# Patient Record
Sex: Female | Born: 1940
Health system: Southern US, Community
[De-identification: ages and names within clinical notes are randomized; demographics above are authoritative.]

## PROBLEM LIST (undated history)

## (undated) DIAGNOSIS — E7800 Pure hypercholesterolemia, unspecified: Secondary | ICD-10-CM

## (undated) DIAGNOSIS — E785 Hyperlipidemia, unspecified: Secondary | ICD-10-CM

## (undated) DIAGNOSIS — I350 Nonrheumatic aortic (valve) stenosis: Secondary | ICD-10-CM

## (undated) DIAGNOSIS — R55 Syncope and collapse: Secondary | ICD-10-CM

## (undated) DIAGNOSIS — I10 Essential (primary) hypertension: Secondary | ICD-10-CM

## (undated) DIAGNOSIS — E079 Disorder of thyroid, unspecified: Secondary | ICD-10-CM

## (undated) DIAGNOSIS — E039 Hypothyroidism, unspecified: Secondary | ICD-10-CM

## (undated) HISTORY — PX: ABDOMINAL HYSTERECTOMY: SHX81

## (undated) HISTORY — DX: Nonrheumatic aortic (valve) stenosis: I35.0

## (undated) HISTORY — DX: Hypothyroidism, unspecified: E03.9

---

## 2017-08-17 ENCOUNTER — Emergency Department (HOSPITAL_COMMUNITY): Payer: Medicare Other

## 2017-08-17 ENCOUNTER — Encounter (HOSPITAL_COMMUNITY): Payer: Self-pay | Admitting: Emergency Medicine

## 2017-08-17 ENCOUNTER — Other Ambulatory Visit: Payer: Self-pay

## 2017-08-17 ENCOUNTER — Inpatient Hospital Stay (HOSPITAL_COMMUNITY)
Admission: EM | Admit: 2017-08-17 | Discharge: 2017-08-19 | DRG: 312 | Disposition: A | Discharge status: Home Health | Payer: Medicare Other | Attending: Family Medicine | Admitting: Family Medicine

## 2017-08-17 ENCOUNTER — Inpatient Hospital Stay (HOSPITAL_COMMUNITY): Payer: Medicare Other

## 2017-08-17 ENCOUNTER — Emergency Department (HOSPITAL_COMMUNITY)
Admission: EM | Admit: 2017-08-17 | Discharge: 2017-08-17 | Disposition: A | Discharge status: Home / Self Care | Payer: Medicare Other | Source: Home / Self Care | Attending: Emergency Medicine | Admitting: Emergency Medicine

## 2017-08-17 DIAGNOSIS — I10 Essential (primary) hypertension: Secondary | ICD-10-CM

## 2017-08-17 DIAGNOSIS — R4189 Other symptoms and signs involving cognitive functions and awareness: Secondary | ICD-10-CM | POA: Diagnosis not present

## 2017-08-17 DIAGNOSIS — M25531 Pain in right wrist: Secondary | ICD-10-CM | POA: Diagnosis present

## 2017-08-17 DIAGNOSIS — R001 Bradycardia, unspecified: Secondary | ICD-10-CM | POA: Diagnosis present

## 2017-08-17 DIAGNOSIS — R011 Cardiac murmur, unspecified: Secondary | ICD-10-CM | POA: Diagnosis not present

## 2017-08-17 DIAGNOSIS — R4701 Aphasia: Secondary | ICD-10-CM

## 2017-08-17 DIAGNOSIS — E039 Hypothyroidism, unspecified: Secondary | ICD-10-CM | POA: Diagnosis present

## 2017-08-17 DIAGNOSIS — E7800 Pure hypercholesterolemia, unspecified: Secondary | ICD-10-CM | POA: Diagnosis not present

## 2017-08-17 DIAGNOSIS — Z9181 History of falling: Secondary | ICD-10-CM

## 2017-08-17 DIAGNOSIS — I050 Rheumatic mitral stenosis: Secondary | ICD-10-CM | POA: Diagnosis present

## 2017-08-17 DIAGNOSIS — R29818 Other symptoms and signs involving the nervous system: Secondary | ICD-10-CM | POA: Diagnosis not present

## 2017-08-17 DIAGNOSIS — E785 Hyperlipidemia, unspecified: Secondary | ICD-10-CM | POA: Diagnosis present

## 2017-08-17 DIAGNOSIS — I350 Nonrheumatic aortic (valve) stenosis: Secondary | ICD-10-CM

## 2017-08-17 DIAGNOSIS — F809 Developmental disorder of speech and language, unspecified: Secondary | ICD-10-CM

## 2017-08-17 DIAGNOSIS — R410 Disorientation, unspecified: Secondary | ICD-10-CM

## 2017-08-17 DIAGNOSIS — R55 Syncope and collapse: Secondary | ICD-10-CM | POA: Diagnosis not present

## 2017-08-17 DIAGNOSIS — M11231 Other chondrocalcinosis, right wrist: Secondary | ICD-10-CM | POA: Diagnosis present

## 2017-08-17 DIAGNOSIS — F0390 Unspecified dementia without behavioral disturbance: Secondary | ICD-10-CM | POA: Diagnosis present

## 2017-08-17 DIAGNOSIS — R9089 Other abnormal findings on diagnostic imaging of central nervous system: Secondary | ICD-10-CM | POA: Diagnosis present

## 2017-08-17 DIAGNOSIS — I361 Nonrheumatic tricuspid (valve) insufficiency: Secondary | ICD-10-CM | POA: Diagnosis not present

## 2017-08-17 DIAGNOSIS — Z79899 Other long term (current) drug therapy: Secondary | ICD-10-CM | POA: Diagnosis not present

## 2017-08-17 DIAGNOSIS — Z8249 Family history of ischemic heart disease and other diseases of the circulatory system: Secondary | ICD-10-CM

## 2017-08-17 HISTORY — DX: Essential (primary) hypertension: I10

## 2017-08-17 HISTORY — DX: Disorder of thyroid, unspecified: E07.9

## 2017-08-17 HISTORY — DX: Hyperlipidemia, unspecified: E78.5

## 2017-08-17 HISTORY — DX: Pure hypercholesterolemia, unspecified: E78.00

## 2017-08-17 HISTORY — DX: Syncope and collapse: R55

## 2017-08-17 LAB — BASIC METABOLIC PANEL
ANION GAP: 8 (ref 5–15)
BUN: 9 mg/dL (ref 6–20)
CHLORIDE: 104 mmol/L (ref 101–111)
CO2: 24 mmol/L (ref 22–32)
Calcium: 8.8 mg/dL — ABNORMAL LOW (ref 8.9–10.3)
Creatinine, Ser: 0.84 mg/dL (ref 0.44–1.00)
GFR calc Af Amer: >60 mL/min (ref >60)
GFR calc non Af Amer: >60 mL/min (ref >60)
GLUCOSE: 170 mg/dL — AB (ref 65–99)
POTASSIUM: 4 mmol/L (ref 3.5–5.1)
Sodium: 136 mmol/L (ref 135–145)

## 2017-08-17 LAB — CBC
HEMATOCRIT: 38.2 % (ref 36.0–46.0)
HEMOGLOBIN: 12.8 g/dL (ref 12.0–15.0)
MCH: 32.5 pg (ref 26.0–34.0)
MCHC: 33.5 g/dL (ref 30.0–36.0)
MCV: 97 fL (ref 78.0–100.0)
Platelets: 138 10*3/uL — ABNORMAL LOW (ref 150–400)
RBC: 3.94 MIL/uL (ref 3.87–5.11)
RDW: 13.4 % (ref 11.5–15.5)
WBC: 9.4 10*3/uL (ref 4.0–10.5)

## 2017-08-17 LAB — URINALYSIS, ROUTINE W REFLEX MICROSCOPIC
Bilirubin Urine: NEGATIVE
GLUCOSE, UA: NEGATIVE mg/dL
KETONES UR: NEGATIVE mg/dL
NITRITE: NEGATIVE
Protein, ur: 30 mg/dL — AB
Specific Gravity, Urine: 1.018 (ref 1.005–1.030)
pH: 6 (ref 5.0–8.0)

## 2017-08-17 LAB — GLUCOSE, CAPILLARY: Glucose-Capillary: 116 mg/dL — ABNORMAL HIGH (ref 65–99)

## 2017-08-17 LAB — I-STAT TROPONIN, ED: TROPONIN I, POC: 0.01 ng/mL (ref 0.00–0.08)

## 2017-08-17 LAB — I-STAT CHEM 8, ED
BUN: 11 mg/dL (ref 6–20)
CREATININE: 0.7 mg/dL (ref 0.44–1.00)
Calcium, Ion: 1.15 mmol/L (ref 1.15–1.40)
Chloride: 101 mmol/L (ref 101–111)
Glucose, Bld: 176 mg/dL — ABNORMAL HIGH (ref 65–99)
HEMATOCRIT: 35 % — AB (ref 36.0–46.0)
HEMOGLOBIN: 11.9 g/dL — AB (ref 12.0–15.0)
POTASSIUM: 3.6 mmol/L (ref 3.5–5.1)
Sodium: 136 mmol/L (ref 135–145)
TCO2: 23 mmol/L (ref 22–32)

## 2017-08-17 LAB — CBG MONITORING, ED: Glucose-Capillary: 138 mg/dL — ABNORMAL HIGH (ref 65–99)

## 2017-08-17 LAB — ETHANOL

## 2017-08-17 MED ORDER — ACETAMINOPHEN 325 MG PO TABS: 650.0000 mg | ORAL_TABLET | ORAL | Status: DC | PRN

## 2017-08-17 MED ORDER — IOPAMIDOL (ISOVUE-370) INJECTION 76%
INTRAVENOUS | Status: AC
  Administered 2017-08-17: 50 mL via INTRAVENOUS
  Filled 2017-08-17: qty 50

## 2017-08-17 MED ORDER — ROSUVASTATIN CALCIUM 20 MG PO TABS
20.0000 mg | ORAL_TABLET | Freq: Every day | ORAL | Status: DC
  Administered 2017-08-17 – 2017-08-19 (×3): 20 mg via ORAL
  Filled 2017-08-17 (×3): qty 1

## 2017-08-17 MED ORDER — ASPIRIN EC 81 MG PO TBEC
81.0000 mg | DELAYED_RELEASE_TABLET | Freq: Every day | ORAL | Status: DC
  Administered 2017-08-17 – 2017-08-19 (×3): 81 mg via ORAL
  Filled 2017-08-17 (×3): qty 1

## 2017-08-17 MED ORDER — LISINOPRIL 5 MG PO TABS
5.0000 mg | ORAL_TABLET | Freq: Every day | ORAL | Status: DC
  Administered 2017-08-17 – 2017-08-19 (×3): 5 mg via ORAL
  Filled 2017-08-17 (×3): qty 1

## 2017-08-17 MED ORDER — ACETAMINOPHEN 160 MG/5ML PO SOLN: 650.0000 mg | ORAL | Status: DC | PRN

## 2017-08-17 MED ORDER — ENOXAPARIN SODIUM 40 MG/0.4ML ~~LOC~~ SOLN
40.0000 mg | SUBCUTANEOUS | Status: DC
  Administered 2017-08-17 – 2017-08-18 (×2): 40 mg via SUBCUTANEOUS
  Filled 2017-08-17 (×2): qty 0.4

## 2017-08-17 MED ORDER — STROKE: EARLY STAGES OF RECOVERY BOOK
Freq: Once | Status: DC
  Filled 2017-08-17: qty 1

## 2017-08-17 MED ORDER — ACETAMINOPHEN 650 MG RE SUPP: 650.0000 mg | RECTAL | Status: DC | PRN

## 2017-08-17 MED ORDER — LEVOTHYROXINE SODIUM 75 MCG PO TABS
75.0000 ug | ORAL_TABLET | Freq: Every day | ORAL | Status: DC
  Administered 2017-08-19: 75 ug via ORAL
  Filled 2017-08-17 (×2): qty 1

## 2017-08-17 NOTE — ED Notes (Signed)
On way to CT 

## 2017-08-17 NOTE — ED Provider Notes (Signed)
[facility]   CSN: [id number] Arrival date & time: 08/17/17  0820     History   Chief Complaint Chief Complaint  Patient presents with  . Loss of Consciousness    HPI Adrienne Jackson is a 76 y.o. female.  Patient presents via ems after unresponsive episode at sister's home.  Patient has gotten up, was sitting at table. States she felt fine/normal. Then was noted to become unresponsive while sitting.   Did not fall or lose postural tone. No seizure activity described. Son assisted to floor. No trauma. Once supine, quickly regained consciousness, and was awake/alert, back to baseline within seconds. Hx similar episode approximately 6 months ago. No other recent episodes. Pt denies palpitations or irregular heart beat sensation. Denies any chest pain or discomfort. No sob. No headache. No numbness/weakness. No change in speech or vision. No recent change in meds. No recent blood loss or melena.    The history is provided by the patient.  Loss of Consciousness   Pertinent negatives include abdominal pain, back pain, chest pain, confusion, fever, headaches, palpitations and vomiting.    Past Medical History:  Diagnosis Date  . Hyperlipidemia   . Hypertension   . Thyroid disease     There are no active problems to display for this patient.   Past Surgical History:  Procedure Laterality Date  . ABDOMINAL HYSTERECTOMY      OB History    No data available       Home Medications    Prior to Admission medications   Not on File    Family History No family history on file.  Social History Social History   Tobacco Use  . Smoking status: Never Smoker  . Smokeless tobacco: Never Used  Substance Use Topics  . Alcohol use: No    Frequency: Never  . Drug use: No     Allergies   Patient has no known allergies.   Review of Systems Review of Systems  Constitutional: Negative for chills and fever.  HENT:  Negative for sore throat.   Eyes: Negative for visual disturbance.  Respiratory: Negative for shortness of breath.   Cardiovascular: Positive for syncope. Negative for chest pain and palpitations.  Gastrointestinal: Negative for abdominal pain, blood in stool, diarrhea and vomiting.  Genitourinary: Negative for dysuria.  Musculoskeletal: Negative for back pain and neck pain.  Skin: Negative for rash.  Neurological: Negative for headaches.  Hematological: Does not bruise/bleed easily.  Psychiatric/Behavioral: Negative for confusion.     Physical Exam Updated Vital Signs There were no vitals taken for this visit.  Physical Exam  Constitutional: She appears well-developed and well-nourished. No distress.  HENT:  Head: Atraumatic.  Mouth/Throat: Oropharynx is clear and moist.  Eyes: Conjunctivae are normal. Pupils are equal, round, and reactive to light. No scleral icterus.  Neck: Neck supple. No tracheal deviation present. No thyromegaly present.  No bruits.   Cardiovascular: Normal rate, regular rhythm and intact distal pulses. Exam reveals no gallop and no friction rub.  Murmur heard. +systolic murmur  Pulmonary/Chest: Effort normal and breath sounds normal. No respiratory distress.  Abdominal: Soft. Normal appearance and bowel sounds are normal. She exhibits no distension. There is no tenderness.  No pulsatile mass.   Genitourinary:  Genitourinary Comments: No cva tenderness  Musculoskeletal: She exhibits no edema.  Mild tenderness right wrist. No focal scaphoid tenderness.   Neurological: She is alert.  Speech fluent. Motor intact bil, stre 5/5. sens grossly intact.  Steady gait.   Skin: Skin is warm and dry. No rash noted. She is not diaphoretic.  Psychiatric: She has a normal mood and affect.  Nursing note and vitals reviewed.    ED Treatments / Results  Labs (all labs ordered are listed, but only abnormal results are displayed) Results for orders placed or performed  during the hospital encounter of 08/17/17  Basic metabolic panel  Result Value Ref Range   Sodium 136 135 - 145 mmol/L   Potassium 4.0 3.5 - 5.1 mmol/L   Chloride 104 101 - 111 mmol/L   CO2 24 22 - 32 mmol/L   Glucose, Bld 170 (H) 65 - 99 mg/dL   BUN 9 6 - 20 mg/dL   Creatinine, Ser 9.14 0.44 - 1.00 mg/dL   Calcium 8.8 (L) 8.9 - 10.3 mg/dL   GFR calc non Af Amer >60 >60 mL/min   GFR calc Af Amer >60 >60 mL/min   Anion gap 8 5 - 15  CBC  Result Value Ref Range   WBC 9.4 4.0 - 10.5 K/uL   RBC 3.94 3.87 - 5.11 MIL/uL   Hemoglobin 12.8 12.0 - 15.0 g/dL   HCT 78.2 95.6 - 21.3 %   MCV 97.0 78.0 - 100.0 fL   MCH 32.5 26.0 - 34.0 pg   MCHC 33.5 30.0 - 36.0 g/dL   RDW 08.6 57.8 - 46.9 %   Platelets 138 (L) 150 - 400 K/uL  Urinalysis, Routine w reflex microscopic  Result Value Ref Range   Color, Urine YELLOW YELLOW   APPearance HAZY (A) CLEAR   Specific Gravity, Urine 1.018 1.005 - 1.030   pH 6.0 5.0 - 8.0   Glucose, UA NEGATIVE NEGATIVE mg/dL   Hgb urine dipstick SMALL (A) NEGATIVE   Bilirubin Urine NEGATIVE NEGATIVE   Ketones, ur NEGATIVE NEGATIVE mg/dL   Protein, ur 30 (A) NEGATIVE mg/dL   Nitrite NEGATIVE NEGATIVE   Leukocytes, UA SMALL (A) NEGATIVE   RBC / HPF 0-5 0 - 5 RBC/hpf   WBC, UA 0-5 0 - 5 WBC/hpf   Bacteria, UA RARE (A) NONE SEEN   Squamous Epithelial / LPF 0-5 (A) NONE SEEN   Mucus PRESENT    Hyaline Casts, UA PRESENT   CBG monitoring, ED  Result Value Ref Range   Glucose-Capillary 138 (H) 65 - 99 mg/dL   Comment 1 Notify RN   I-stat troponin, ED  Result Value Ref Range   Troponin i, poc 0.01 0.00 - 0.08 ng/mL   Comment 3           Dg Wrist Complete Right  Result Date: 08/17/2017 CLINICAL DATA:  Right wrist pain and swelling. EXAM: RIGHT WRIST - COMPLETE 3+ VIEW COMPARISON:  None. FINDINGS: No acute fracture or malalignment. Moderate first CMC and mild triscaphe degenerative changes. Chondrocalcinosis of the TFCC. Osteopenia. Mild soft tissue swelling  about the wrist. IMPRESSION: 1. Mild soft tissue swelling about the wrist. No acute osseous abnormality. 2. Chondrocalcinosis of the TFCC, as can be seen with CPPD arthropathy. 3. Moderate first CMC joint degenerative changes. Electronically Signed   By: Obie Dredge M.D.   On: 08/17/2017 11:50    EKG  EKG Interpretation  Date/Time:  Thursday August 17 2017 08:26:17 EST Ventricular Rate:  69 PR Interval:    QRS Duration: 102 QT Interval:  415 QTC Calculation: 445 R Axis:   65 Text Interpretation:  Sinus rhythm No previous tracing Confirmed by Cathren Laine ([id number]) on 08/17/2017 8:32:14 AM  Radiology No results found.  Procedures Procedures (including critical care time)  Medications Ordered in ED Medications - No data to display   Initial Impression / Assessment and Plan / ED Course  I have reviewed the triage vital signs and the nursing notes.  Pertinent labs & imaging results that were available during my care of the patient were reviewed by me and considered in my medical decision making (see chart for details).  Iv ns. Continuous pulse ox and monitor.   Labs.  Ecg.  Reviewed nursing notes and prior charts for additional history.   Pt w periods of hr as low as mid 40's. No recurrent faintness or dizziness. bp normal.   Is on b blocker - will hold.    Family indicates murmur is not new.   Patient remains asymptomatic, and currently appears stable for d/c.     Final Clinical Impressions(s) / ED Diagnoses   Final diagnoses:  None    ED Discharge Orders    None       Cathren LaineSteinl, Rhylynn Perdomo, MD 08/17/17 1201

## 2017-08-17 NOTE — ED Notes (Signed)
Pt ambulated without assistance

## 2017-08-17 NOTE — ED Provider Notes (Signed)
[facility]   CSN: [id number] Arrival date & time: 08/17/17  1709     History   Chief Complaint Chief Complaint  Patient presents with  . Loss of Consciousness    HPI Adrienne Jackson is a 76 y.o. female.  76 yo F with a chief complaint of a syncopal event.  This occurred twice today.  Both times occurred while the patient was eating.  She suddenly became pale and was slow to respond and then became unresponsive.  Was seen earlier today and had an unremarkable workup and was discharged home.  She had a repeat event this evening.  After which she had continued difficulty with speech and some apparent confusion.  They deny head injury.  Denies fevers.  Denies cough congestion.  Denies nausea vomiting or diarrhea.  Has been eating and drinking normally.  The patient has had some history of progressive dementia.   The history is provided by the patient and a relative.  Loss of Consciousness   This is a recurrent problem. The current episode started less than 1 hour ago. The problem occurs rarely. The problem has been resolved. She lost consciousness for a period of 1 to 5 minutes. The problem is associated with normal activity (eating). Associated symptoms include weakness. Pertinent negatives include chest pain, congestion, dizziness, fever, headaches, nausea, palpitations and vomiting. She has tried nothing for the symptoms. The treatment provided no relief.    Past Medical History:  Diagnosis Date  . Hyperlipidemia   . Hypertension   . Thyroid disease     Patient Active Problem List   Diagnosis Date Noted  . Syncope 08/17/2017    Past Surgical History:  Procedure Laterality Date  . ABDOMINAL HYSTERECTOMY      OB History    No data available       Home Medications    Prior to Admission medications   Medication Sig Start Date End Date Taking? Authorizing Provider  aspirin EC 81 MG tablet Take 81 mg by mouth  daily.   Yes [provider]  bisoprolol-hydrochlorothiazide (ZIAC) 5-6.25 MG tablet Take 1 tablet by mouth daily.   Yes [provider]  levothyroxine (SYNTHROID, LEVOTHROID) 75 MCG tablet Take 75 mcg by mouth daily before breakfast.   Yes [provider]  lisinopril (PRINIVIL,ZESTRIL) 5 MG tablet Take 5 mg by mouth daily. 08/14/17  Yes [provider]  rosuvastatin (CRESTOR) 20 MG tablet Take 20 mg by mouth daily.   Yes [provider]    Family History History reviewed. No pertinent family history.  Social History Social History   Tobacco Use  . Smoking status: Never Smoker  . Smokeless tobacco: Never Used  Substance Use Topics  . Alcohol use: No    Frequency: Never  . Drug use: No     Allergies   Patient has no known allergies.   Review of Systems Review of Systems  Constitutional: Negative for chills and fever.  HENT: Negative for congestion and rhinorrhea.   Eyes: Negative for redness and visual disturbance.  Respiratory: Negative for shortness of breath and wheezing.   Cardiovascular: Positive for syncope. Negative for chest pain and palpitations.  Gastrointestinal: Negative for nausea and vomiting.  Genitourinary: Negative for dysuria and urgency.  Musculoskeletal: Negative for arthralgias and myalgias.  Skin: Negative for pallor and wound.  Neurological: Positive for syncope and weakness. Negative for dizziness and headaches.     Physical Exam Updated Vital Signs BP Marland Kitchen(!)  152/71   Pulse 71   Temp 98.8 F (37.1 C) (Oral)   Resp (!) 23   SpO2 97%   Physical Exam  Constitutional: She is oriented to person, place, and time. She appears well-developed and well-nourished. No distress.  HENT:  Head: Normocephalic and atraumatic.  Eyes: EOM are normal. Pupils are equal, round, and reactive to light.  Neck: Normal range of motion. Neck supple.  Cardiovascular: Normal rate and regular rhythm. Exam reveals no gallop and  no friction rub.  Murmur heard.  Systolic ( Best heard at the mitral pole.) murmur is present with a grade of 6/6. Pulmonary/Chest: Effort normal. She has no wheezes. She has no rales.  Abdominal: Soft. She exhibits no distension. There is no tenderness.  Musculoskeletal: She exhibits no edema or tenderness.  Neurological: She is alert and oriented to person, place, and time.  Patient is aphasic.  She has trouble with naming.  She has trouble repeating complex statements.  She is able to sometimes find the specific word.  Right upper extremity weakness.  Patient did have some right wrist pain and this may be secondary to that.  4 out of 5 compared to left 5 out of 5.  Skin: Skin is warm and dry. She is not diaphoretic.  Psychiatric: She has a normal mood and affect. Her behavior is normal.  Nursing note and vitals reviewed.    ED Treatments / Results  Labs (all labs ordered are listed, but only abnormal results are displayed) Labs Reviewed  I-STAT CHEM 8, ED - Abnormal; Notable for the following components:      Result Value   Glucose, Bld 176 (*)    Hemoglobin 11.9 (*)    HCT 35.0 (*)    All other components within normal limits  ETHANOL    EKG  EKG Interpretation  Date/Time:  Thursday August 17 2017 17:16:47 EST Ventricular Rate:  76 PR Interval:    QRS Duration: 96 QT Interval:  375 QTC Calculation: 422 R Axis:   73 Text Interpretation:  Sinus rhythm No significant change since last tracing Confirmed by Melene Plan [phone]) on 08/17/2017 5:36:40 PM       Radiology Dg Wrist Complete Right  Result Date: 08/17/2017 CLINICAL DATA:  Right wrist pain and swelling. EXAM: RIGHT WRIST - COMPLETE 3+ VIEW COMPARISON:  None. FINDINGS: No acute fracture or malalignment. Moderate first CMC and mild triscaphe degenerative changes. Chondrocalcinosis of the TFCC. Osteopenia. Mild soft tissue swelling about the wrist. IMPRESSION: 1. Mild soft tissue swelling about the wrist. No acute  osseous abnormality. 2. Chondrocalcinosis of the TFCC, as can be seen with CPPD arthropathy. 3. Moderate first CMC joint degenerative changes. Electronically Signed   By: Obie Dredge M.D.   On: 08/17/2017 11:50   Ct Head Wo Contrast  Result Date: 08/17/2017 CLINICAL DATA:  Altered level of consciousness. Multiple syncopal episodes today. Slight headache. EXAM: CT HEAD WITHOUT CONTRAST TECHNIQUE: Contiguous axial images were obtained from the base of the skull through the vertex without intravenous contrast. COMPARISON:  None. FINDINGS: Brain: No evidence of acute infarction, hemorrhage, hydrocephalus, extra-axial collection or mass lesion/mass effect. Brain volume is overall normal for age but there is asymmetric left temporal atrophy and sulcal widening. This is of indeterminate significance in the absence of dementia/ memory loss history. Vascular: No hyperdense vessel or unexpected calcification. Skull: Normal. Negative for fracture or focal lesion. Sinuses/Orbits: Unremarkable IMPRESSION: No acute finding. Electronically Signed   By: Kathrynn Ducking.D.  On: 08/17/2017 18:47    Procedures Procedures (including critical care time)  Medications Ordered in ED Medications  iopamidol (ISOVUE-370) 76 % injection (not administered)     Initial Impression / Assessment and Plan / ED Course  I have reviewed the triage vital signs and the nursing notes.  Pertinent labs & imaging results that were available during my care of the patient were reviewed by me and considered in my medical decision making (see chart for details).     76 yo F with 2 separate syncopal events today.  After the second 1 the patient is been persistently confused.  CT of the head without acute finding.  I will discussed the case with neurology though it is a bit atypical from a stroke presentation to have syncopal events.  As this is the patient's second event today we will discuss with the hospitalist for telemetry  monitoring and echocardiogram.  I discussed the case with Dr. Laurence SlateAroor, he came down and evaluated the patient.  Recommended an MRI of the brain and EEG.   The patients results and plan were reviewed and discussed.   Any x-rays performed were independently reviewed by myself.   Differential diagnosis were considered with the presenting HPI.  Medications  iopamidol (ISOVUE-370) 76 % injection (not administered)    Vitals:   08/17/17 1730 08/17/17 1800 08/17/17 1815 08/17/17 1915  BP: (!) 139/55 (!) 140/54 (!) 148/57 (!) 152/71  Pulse: 71 68 70 71  Resp: (!) 22 17 (!) 22 (!) 23  Temp:      TempSrc:      SpO2: 99% 97% 97% 97%    Final diagnoses:  Syncope and collapse  Aphasia  Confusion    Admission/ observation were discussed with the admitting physician, patient and/or family and they are comfortable with the plan.    Final Clinical Impressions(s) / ED Diagnoses   Final diagnoses:  Syncope and collapse  Aphasia  Confusion    ED Discharge Orders    None       Melene PlanFloyd, Mahrosh Donnell, DO 08/17/17 2032

## 2017-08-17 NOTE — ED Triage Notes (Signed)
Per EMS, patient is from home. Was eating at table and had a syncopal episode.  Just discharged earlier today due to a syncopal episode.  No CP, Diaphoresis, SOB, N/V.  149 CBG, 122/66, 75 HR, 97% RA, EKG NSR

## 2017-08-17 NOTE — H&P (Signed)
Family Medicine Teaching [facility] Admission History and Physical Service Pager: [phone]  Patient name: Adrienne Jackson Medical record number: [id number] Date of birth: 05/16/1941 Age: 76 y.o. Gender: female  Primary Care Provider: Patient, No Pcp Per Consultants: Neurology (called in ED) Code Status: Full   Chief Complaint: episodes of decreased responsiveness  Assessment and Plan: Adrienne Jackson is a 76 y.o. female presenting with repeat syncopal events. PMH is significant for HTN, HLD, and hypothyroidism.  #Episodes of decreased consciousness and aphasia: Per report, concerning for true syncope. Patient also with word-finding difficulty concerning for stroke. CT head without acute abnormality; asymmetric left temporal atrophy and sulcal widening noted. CT angio head/neck with only mild atherosclerosis without flow-limited stenosis. MRI brain with asymmetric left temporal lobe atrophy. History of murmur, though outside records not available. Unable to assess if patient has had decreased stamina; she denies chest pain or shortness of breath. Bradycardia to 40s in ED earlier today and takes beta blocker at home. HR regular and no known history of afib. EKG NSR. Patient with memory concerns and some transient trouble with speech previously, per family. Will admit to complete stroke work-up and rule-out seizures. Stroke risk factors include HTN, HLD, age, family history. Concern for valvular disease causing syncope given significant murmur on exam and improvement earlier today with supine positioning. No fever or WBC to suggest infection, but UA with leuks and hgb. EtOH level negative. Patient not hypoglycemic with CBG of 138 and glucose of 170 on BMP. Na normal at 136.  - Admit to telemetry, attending Dr. McDiarmid - Neurology consulted in ED; Dr. Laurence SlateAroor recommended CT angio head/neck, MRI brain, EEG; appreciate recommendations - Will obtain stroke risk stratification labs: A1c, lipid  panel, TSH - Will obtain reversible causes of dementia labs: RPR, B12, TSH; no anemia on initial CBC - ECHO - orthostatics - q4h neuro checks - swallow eval - Urine culture - PT/OT evaluation  #HTN: Slightly hypertensive to SBP of 152. home medications include lisinopril 5 mg, bisoprolol-hctz 5-6.25 mg; not taken today - continue lisinopril 5 mg - hold beta blocker in setting of bradycardia earlier today  #HLD: on crestor 20 mg daily - continue crestor - obtain lipid panel in a.m.   #Hypothyroidism: takes levothyroxine 75 mcg daily - Continue home medication - Obtain TSH in a.m.  #Heart murmur: Known per son, and patient followed by a cardiologist in [place]. - Obtain ECHO  #Right wrist pain: S/p falling earlier today. Mild soft tissue swelling on xray and chondrocalcinosis. - Ice prn - tylenol prn  FEN/GI: NPO until swallow screen then heart healthy Prophylaxis: lovenox  Disposition: pending syncope work-up  History of Present Illness:  Adrienne Jackson is a 76 y.o. female presenting with two episodes of decreased responsiveness today. First occurred this morning around 7 am at the breakfast table. Patient's head dropped during both episodes while eating. She became pale during the first episode and was lowered to the ground by a family member; she became more responsive when laid on the ground as instructed by EMS. She was evaluated in the ED this morning and had intact strength and fluent speech. Workup at that time was unremarkable with negative troponin, sinus rhythm on EKG, and no gross lab abnormalities. It was recommended she hold her beta blocker as she had heart rate into 40s at times. She was discharged home and had another episode at dinnertime. Son confirms during both episodes she never fully collapsed. Patient did lose consciousness for a brief period during  second episode. No loss of bowel or bladder function.  Patient says she had a similar episode earlier  this year where her head dropped. Son confirms this occurred in May and was not fully worked up. He says he and his sister have been concerned; they and other family members have tried to encourage patient to seek medical evaluation for memory difficulties. She has avoided work-up, per son. He reports patient has periods of being "off" and that she is forgetful; he has noted this for at least a year. He lives out of town but says his mother sometimes sounds fine on the phone and other times has difficulty with her speech. He is worried about mini strokes.   Review Of Systems: Per HPI with the following additions: Note some limitation due to patient's aphasia.   Review of Systems  Constitutional: Negative for fever.  HENT: Negative for hearing loss.   Eyes: Negative for blurred vision and pain.  Respiratory: Negative for cough and shortness of breath.   Cardiovascular: Negative for chest pain and palpitations.  Gastrointestinal: Negative for abdominal pain, constipation, diarrhea and vomiting.  Genitourinary: Negative for dysuria.  Musculoskeletal: Negative for back pain and falls.  Skin: Negative for rash.  Neurological: Positive for speech change.    Patient Active Problem List   Diagnosis Date Noted  . Syncope 08/17/2017    Past Medical History: Past Medical History:  Diagnosis Date  . Hyperlipidemia   . Hypertension   . Thyroid disease     Past Surgical History: Past Surgical History:  Procedure Laterality Date  . ABDOMINAL HYSTERECTOMY      Social History: Social History   Tobacco Use  . Smoking status: Never Smoker  . Smokeless tobacco: Never Used  Substance Use Topics  . Alcohol use: No    Frequency: Never  . Drug use: No   Additional social history: Rare alcohol use (once a month). Lives independently. Never has used tobacco. Denies drug use.  Please also refer to relevant sections of EMR.  Family History: History reviewed. No pertinent family  history. Father with history of stroke.   Allergies and Medications: No Known Allergies No current facility-administered medications on file prior to encounter.    Current Outpatient Medications on File Prior to Encounter  Medication Sig Dispense Refill  . aspirin EC 81 MG tablet Take 81 mg by mouth daily.    . bisoprolol-hydrochlorothiazide (ZIAC) 5-6.25 MG tablet Take 1 tablet by mouth daily.    Marland Kitchen levothyroxine (SYNTHROID, LEVOTHROID) 75 MCG tablet Take 75 mcg by mouth daily before breakfast.    . lisinopril (PRINIVIL,ZESTRIL) 5 MG tablet Take 5 mg by mouth daily.  1  . rosuvastatin (CRESTOR) 20 MG tablet Take 20 mg by mouth daily.      Objective: BP (!) 152/71   Pulse 71   Temp 98.8 F (37.1 C) (Oral)   Resp (!) 23   SpO2 97%   Exam: General: Older female, in NAD, sitting up in bed Eyes: PERRL, EOM intact.  ENTM: MMM. No nasal congestion.  Neck: Supple, FROM.  Cardiovascular: RRR, loud SEM heard best over left lower sternal border.  Respiratory: CTAB, no increased WOB Gastrointestinal: +BS, NT, ND MSK: Normal bulk and tone, no LE edema. Swelling of R medial wrist. Derm: Ecchymosis of palmar aspect of R hand. No rashes noted on exposed skin. Neuro: Aphasic, speech not fluid but seems to comprehend, can intermittently repeat. Difficulty naming objects. Is AOx3. Finger-nose-finger testing normal on left; not as smooth  on the right. UE strength 5/5 bilaterally but decreased grip strength on right (however, swelling of wrist and bruising of fingers likely contributing); LE strength 5/5 bilaterally. CNII-XII intact.  Psych: Norma mood and affect--does not appear anxious.   Labs and Imaging: CBC BMET  Recent Labs  Lab 08/17/17 0825 08/17/17 1759  WBC 9.4  --   HGB 12.8 11.9*  HCT 38.2 35.0*  PLT 138*  --    Recent Labs  Lab 08/17/17 0825 08/17/17 1759  NA 136 136  K 4.0 3.6  CL 104 101  CO2 24  --   BUN 9 11  CREATININE 0.84 0.70  GLUCOSE 170* 176*  CALCIUM 8.8*   --      Ct Angio Head W Or Wo Contrast  Result Date: 08/17/2017 CLINICAL DATA:  Focal neuro deficit. Stroke suspected. Per previous history there is altered level of consciousness of multiple syncopal episodes today. EXAM: CT ANGIOGRAPHY HEAD AND NECK TECHNIQUE: Multidetector CT imaging of the head and neck was performed using the standard protocol during bolus administration of intravenous contrast. Multiplanar CT image reconstructions and MIPs were obtained to evaluate the vascular anatomy. Carotid stenosis measurements (when applicable) are obtained utilizing NASCET criteria, using the distal internal carotid diameter as the denominator. CONTRAST:  50 cc Isovue 370 intravenous COMPARISON:  Head CT from earlier today FINDINGS: CTA NECK FINDINGS Aortic arch: Partially covered arch that is atheromatous. Brachiocephalic and complete left common carotid origins are not covered. Right carotid system: Minimal atheromatous changes. No stenosis or ulceration. Left carotid system: Mild atheromatous changes at the common carotid bifurcation and origin. No stenosis or ulceration. Vertebral arteries: No proximal subclavian stenosis. Mild right vertebral artery dominance. Streak artifact affects visualization of vessels at the level of the oral cavity. No flow limiting stenosis or dissection is suspected. Skeleton: Facet arthropathy and disc degeneration. No acute osseous finding. Other neck: No incidental mass or adenopathy. Upper chest: No acute finding Review of the MIP images confirms the above findings CTA HEAD FINDINGS Anterior circulation: Atherosclerotic plaque on the carotid siphons. No flow limiting stenosis or ulceration. Negative for branch occlusion. Negative for aneurysm. Posterior circulation: Mild right vertebral artery dominance. No branch occlusion, flow limiting stenosis, or aneurysm. Mild atheromatous type narrowing of the mid right P2 segment Venous sinuses: Patent Anatomic variants: None significant  Delayed phase: No abnormal intracranial enhancement. Review of the MIP images confirms the above findings IMPRESSION: 1. No acute finding. 2. Mild atherosclerosis without flow limiting stenosis in the head or neck. Electronically Signed   By: Marnee Spring M.D.   On: 08/17/2017 21:09   Dg Wrist Complete Right  Result Date: 08/17/2017 CLINICAL DATA:  Right wrist pain and swelling. EXAM: RIGHT WRIST - COMPLETE 3+ VIEW COMPARISON:  None. FINDINGS: No acute fracture or malalignment. Moderate first CMC and mild triscaphe degenerative changes. Chondrocalcinosis of the TFCC. Osteopenia. Mild soft tissue swelling about the wrist. IMPRESSION: 1. Mild soft tissue swelling about the wrist. No acute osseous abnormality. 2. Chondrocalcinosis of the TFCC, as can be seen with CPPD arthropathy. 3. Moderate first CMC joint degenerative changes. Electronically Signed   By: Obie Dredge M.D.   On: 08/17/2017 11:50   Ct Head Wo Contrast  Result Date: 08/17/2017 CLINICAL DATA:  Altered level of consciousness. Multiple syncopal episodes today. Slight headache. EXAM: CT HEAD WITHOUT CONTRAST TECHNIQUE: Contiguous axial images were obtained from the base of the skull through the vertex without intravenous contrast. COMPARISON:  None. FINDINGS: Brain: No evidence of  acute infarction, hemorrhage, hydrocephalus, extra-axial collection or mass lesion/mass effect. Brain volume is overall normal for age but there is asymmetric left temporal atrophy and sulcal widening. This is of indeterminate significance in the absence of dementia/ memory loss history. Vascular: No hyperdense vessel or unexpected calcification. Skull: Normal. Negative for fracture or focal lesion. Sinuses/Orbits: Unremarkable IMPRESSION: No acute finding. Electronically Signed   By: Marnee SpringJonathon  Watts M.D.   On: 08/17/2017 18:47   Ct Angio Neck W And/or Wo Contrast  Result Date: 08/17/2017 CLINICAL DATA:  Focal neuro deficit. Stroke suspected. Per previous  history there is altered level of consciousness of multiple syncopal episodes today. EXAM: CT ANGIOGRAPHY HEAD AND NECK TECHNIQUE: Multidetector CT imaging of the head and neck was performed using the standard protocol during bolus administration of intravenous contrast. Multiplanar CT image reconstructions and MIPs were obtained to evaluate the vascular anatomy. Carotid stenosis measurements (when applicable) are obtained utilizing NASCET criteria, using the distal internal carotid diameter as the denominator. CONTRAST:  50 cc Isovue 370 intravenous COMPARISON:  Head CT from earlier today FINDINGS: CTA NECK FINDINGS Aortic arch: Partially covered arch that is atheromatous. Brachiocephalic and complete left common carotid origins are not covered. Right carotid system: Minimal atheromatous changes. No stenosis or ulceration. Left carotid system: Mild atheromatous changes at the common carotid bifurcation and origin. No stenosis or ulceration. Vertebral arteries: No proximal subclavian stenosis. Mild right vertebral artery dominance. Streak artifact affects visualization of vessels at the level of the oral cavity. No flow limiting stenosis or dissection is suspected. Skeleton: Facet arthropathy and disc degeneration. No acute osseous finding. Other neck: No incidental mass or adenopathy. Upper chest: No acute finding Review of the MIP images confirms the above findings CTA HEAD FINDINGS Anterior circulation: Atherosclerotic plaque on the carotid siphons. No flow limiting stenosis or ulceration. Negative for branch occlusion. Negative for aneurysm. Posterior circulation: Mild right vertebral artery dominance. No branch occlusion, flow limiting stenosis, or aneurysm. Mild atheromatous type narrowing of the mid right P2 segment Venous sinuses: Patent Anatomic variants: None significant Delayed phase: No abnormal intracranial enhancement. Review of the MIP images confirms the above findings IMPRESSION: 1. No acute  finding. 2. Mild atherosclerosis without flow limiting stenosis in the head or neck. Electronically Signed   By: Marnee SpringJonathon  Watts M.D.   On: 08/17/2017 21:09   Mr Brain Wo Contrast  Result Date: 08/17/2017 CLINICAL DATA:  Unexplained altered level of consciousness. EXAM: MRI HEAD WITHOUT CONTRAST TECHNIQUE: Multiplanar, multiecho pulse sequences of the brain and surrounding structures were obtained without intravenous contrast. COMPARISON:  CT head and CTA head neck from earlier the same day. FINDINGS: Brain: No acute infarction, hemorrhage, hydrocephalus, extra-axial collection or mass lesion. There is notable asymmetric left temporal lobe atrophy with sulcal widening and hippocampus thinning. Elsewhere brain volume is unremarkable for age. Per notes, the patient does have a history of dementia. Normal white matter appearance. Vascular: Major flow voids are preserved. Skull and upper cervical spine: Cervical disc and facet degeneration. Sinuses/Orbits: Negative IMPRESSION: 1. No acute finding. 2. Asymmetric left temporal lobe atrophy, a pattern seen with semantic dementia. Electronically Signed   By: Marnee SpringJonathon  Watts M.D.   On: 08/17/2017 21:53    Casey BurkittFitzgerald, Hillary Moen, MD 08/17/2017, 10:04 PM PGY-3, [facility] FPTS Intern pager: [phone], text pages welcome

## 2017-08-17 NOTE — Discharge Instructions (Addendum)
It was our pleasure to provide your ER care today - we hope that you feel better.  Your heart rate gets as low as the mid 40's - one of your medications can contribute to low heart rate (the Biso Fum/HCT)  and fainting episode - for now, stop taking this medication until you follow up with your doctor.   We also noted a heart murmur (it is possible you've had this for a long time).  Given fainting episode, low heart rate, and murmur - follow up with cardiologist in the next  1-2 weeks - discuss possible ultrasound/ECHO of the heart.    If you do not already have a cardiologist, your primary care doctor may be able to help facilitate this follow up.   For wrist, icepack/cold to sore area. Take acetaminophen as need.   Return to ER if worse, new symptoms, fevers, recurrent fainting, chest pain, trouble breathing, other concern.

## 2017-08-17 NOTE — ED Notes (Signed)
Pt given snack and drink 

## 2017-08-17 NOTE — Consult Note (Signed)
Neurology Consultation Reason for Consult: Seizure Referring Physician: Dr. Adela LankFloyd  CC: 2 episodes of passing out  History is obtained from: Patient son and chart review  HPI: Adrienne Jackson is a 76 y.o. female with hypertension, hyperlipidemia and possible dementia per son presented to the emergency room the second time today after coming unresponsive at the dinner table earlier this evening. She has similar episode earlier this morning and is evaluated in the emergency room and discharged.   The patient son says that she is having her food, when she suddenly stared off and starting sliding out of her chair when he caught her. She was then unresponsive for about a minute fall with confusion afterwards. The patient's son states she still has returned back to her baseline and still appears to be confused. She was following the patient grabbed her wrist on the right side and an this has a slightly weaker right hand grip. She had an episode earlier in the day her sister's house. While she was sitting at the table she suddenly became unresponsive, both her arms were to her front of her slightly increased tone. She did not have any jerking movements. She did not bite her tongue or her urinary incontinence. She was brought to the emergency room was noted on her heart rates were in the mid 40s and she was discharged after holding  her beta blocker. Son states that since she went home she still has been confused and had not returned to her baseline.   ROS: A 14 point ROS was performed and is negative except as noted in the HPI.   Past Medical History:  Diagnosis Date  . Hyperlipidemia   . Hypertension   . Thyroid disease      History reviewed. No pertinent family history.   Social History:  reports that  has never smoked. she has never used smokeless tobacco. She reports that she does not drink alcohol or use drugs.   Exam: Current vital signs: BP (!) 152/71   Pulse 71   Temp 98.8 F  (37.1 C) (Oral)   Resp (!) 23   SpO2 97%  Vital signs in last 24 hours: Temp:  [98.5 F (36.9 C)-98.8 F (37.1 C)] 98.8 F (37.1 C) (11/22 1715) Pulse Rate:  [48-73] 71 (11/22 1915) Resp:  [14-23] 23 (11/22 1915) BP: (118-158)/(51-71) 152/71 (11/22 1915) SpO2:  [97 %-100 %] 97 % (11/22 1915)   Physical Exam  Constitutional: Appears well-developed and well-nourished.  Psych: Affect appropriate to situation Eyes: No scleral injection HENT: No OP obstrucion Head: Normocephalic.  Cardiovascular: Normal rate and regular rhythm.  Respiratory: Effort normal and breath sounds normal to anterior ascultation GI: Soft.  No distension. There is no tenderness.  Skin: WDI  Neuro: Mental Status: Patient is awake, alert, oriented to person, month, year. She has difficulty answering questions and is unable to repeat sentences Patient is able to give a clear and coherent history.  Cranial Nerves: II: Visual Fields are full. Pupils are equal, round, and reactive to light.  III,IV, VI: EOMI without ptosis or diploplia.  V: Facial sensation is symmetric to temperature VII: Facial movement is symmetric.  VIII: hearing is intact to voice X: Uvula elevates symmetrically XI: Shoulder shrug is symmetric. XII: tongue is midline without atrophy or fasciculations.  Motor: Tone is normal. Bulk is normal. 5/5 strength was present in all four extremities.  Sensory: Sensation is symmetric to light touch and temperature in the arms and legs. Deep Tendon Reflexes: 2+  and symmetric in the biceps and patellae. Plantars: Toes are downgoing bilaterally.  Cerebellar: FNF and HKS are intact bilaterally  ASSESSMENT AND PLAN   76 year old female with history of hypertension, hyperlipidemia and memory impairment according to her son presents with 2 episodes of passing out followed by confusion and difficulty speech and comprehension. Patient had a passing out episode 6 months ago CT head showed no acute  abnormality and MRI brain was performed which showed left temporal atrophy. Patient also found to be bradycardic in the emergency room.   Seizure versus syncope Based on description of event, not classical for partial seizure however very high risk given likely diagnoses of dementia and left temporal atrophy. The patient is also fairly elderly and cardiac arrhythmias need to be ruled out.  Recommendations MR brain shows no acute stroke Routine EEG tomorrow morning Telemetry and EKG Trial of Keppra 500 mg twice a day Seizure precautions  Possible dementia MRI brain is concerning for hippocampal atrophy and left temporal lobe atrophy which is seen in dementia Recommend ordering B12, TSH Patient needs a neuropsych evaluation to be done as an outpatient   Seizure precautions: Per [facility]  [place] statutes, patients with seizures are not allowed to drive until they have been seizure-free for six months. Use caution when using heavy equipment or power tools. Avoid working on ladders or at heights. Take showers instead of baths. Ensure the water temperature is not too high on the home water heater. Do not go swimming alone. Do not lock yourself in a room alone (i.e. bathroom). When caring for infants or small children, sit down when holding, feeding, or changing them to minimize risk of injury to the child in the event you have a seizure. Maintain good sleep hygiene. Avoid alcohol.    If Adrienne Jackson has another seizure, call 911 and bring them back to the ED if:       A.  The seizure lasts longer than 5 minutes.            B.  The patient doesn't wake shortly after the seizure or has new problems such as difficulty seeing, speaking or moving following the seizure       C.  The patient was injured during the seizure       D.  The patient has a temperature over 102 F (39C)       E.  The patient vomited during the seizure and now is having trouble breathing

## 2017-08-17 NOTE — ED Triage Notes (Signed)
Patient from home with [facility] for witnessed syncopal episode while seated in chair, unresponsive for 10 seconds. No fall, lowered to ground by family. Family states same thing happened in May of this year, was not assessed at that time. CGB 239, no history of diabetes. 20g saline lock in right AC. Alert and oriented and in no apparent distress at this time.

## 2017-08-18 ENCOUNTER — Other Ambulatory Visit: Payer: Self-pay

## 2017-08-18 ENCOUNTER — Inpatient Hospital Stay (HOSPITAL_COMMUNITY): Payer: Medicare Other

## 2017-08-18 ENCOUNTER — Encounter (HOSPITAL_COMMUNITY): Payer: Self-pay | Admitting: Internal Medicine

## 2017-08-18 DIAGNOSIS — R410 Disorientation, unspecified: Secondary | ICD-10-CM

## 2017-08-18 DIAGNOSIS — I10 Essential (primary) hypertension: Secondary | ICD-10-CM

## 2017-08-18 DIAGNOSIS — R011 Cardiac murmur, unspecified: Secondary | ICD-10-CM | POA: Diagnosis present

## 2017-08-18 DIAGNOSIS — I350 Nonrheumatic aortic (valve) stenosis: Secondary | ICD-10-CM

## 2017-08-18 DIAGNOSIS — R55 Syncope and collapse: Secondary | ICD-10-CM

## 2017-08-18 DIAGNOSIS — M11231 Other chondrocalcinosis, right wrist: Secondary | ICD-10-CM | POA: Diagnosis present

## 2017-08-18 DIAGNOSIS — F809 Developmental disorder of speech and language, unspecified: Secondary | ICD-10-CM

## 2017-08-18 DIAGNOSIS — R4701 Aphasia: Secondary | ICD-10-CM

## 2017-08-18 DIAGNOSIS — R9089 Other abnormal findings on diagnostic imaging of central nervous system: Secondary | ICD-10-CM | POA: Diagnosis present

## 2017-08-18 DIAGNOSIS — E7800 Pure hypercholesterolemia, unspecified: Secondary | ICD-10-CM

## 2017-08-18 DIAGNOSIS — R29818 Other symptoms and signs involving the nervous system: Secondary | ICD-10-CM

## 2017-08-18 DIAGNOSIS — R4189 Other symptoms and signs involving cognitive functions and awareness: Secondary | ICD-10-CM | POA: Diagnosis present

## 2017-08-18 DIAGNOSIS — I361 Nonrheumatic tricuspid (valve) insufficiency: Secondary | ICD-10-CM

## 2017-08-18 HISTORY — DX: Essential (primary) hypertension: I10

## 2017-08-18 HISTORY — DX: Pure hypercholesterolemia, unspecified: E78.00

## 2017-08-18 HISTORY — DX: Syncope and collapse: R55

## 2017-08-18 LAB — LIPID PANEL
CHOLESTEROL: 128 mg/dL (ref 0–200)
HDL: 69 mg/dL (ref >40)
LDL Cholesterol: 49 mg/dL (ref 0–99)
Total CHOL/HDL Ratio: 1.9 RATIO
Triglycerides: 52 mg/dL (ref <150)
VLDL: 10 mg/dL (ref 0–40)

## 2017-08-18 LAB — VITAMIN B12: VITAMIN B 12: 293 pg/mL (ref 180–914)

## 2017-08-18 LAB — BASIC METABOLIC PANEL
Anion gap: 6 (ref 5–15)
BUN: 8 mg/dL (ref 6–20)
CALCIUM: 8.8 mg/dL — AB (ref 8.9–10.3)
CHLORIDE: 104 mmol/L (ref 101–111)
CO2: 26 mmol/L (ref 22–32)
CREATININE: 0.73 mg/dL (ref 0.44–1.00)
GFR calc non Af Amer: >60 mL/min (ref >60)
Glucose, Bld: 114 mg/dL — ABNORMAL HIGH (ref 65–99)
Potassium: 3.4 mmol/L — ABNORMAL LOW (ref 3.5–5.1)
SODIUM: 136 mmol/L (ref 135–145)

## 2017-08-18 LAB — GLUCOSE, CAPILLARY
GLUCOSE-CAPILLARY: 96 mg/dL (ref 65–99)
Glucose-Capillary: 126 mg/dL — ABNORMAL HIGH (ref 65–99)
Glucose-Capillary: 148 mg/dL — ABNORMAL HIGH (ref 65–99)
Glucose-Capillary: 117 mg/dL — ABNORMAL HIGH (ref 65–99)

## 2017-08-18 LAB — CBC
HCT: 36.8 % (ref 36.0–46.0)
HEMOGLOBIN: 12.2 g/dL (ref 12.0–15.0)
MCH: 32 pg (ref 26.0–34.0)
MCHC: 33.2 g/dL (ref 30.0–36.0)
MCV: 96.6 fL (ref 78.0–100.0)
Platelets: 134 10*3/uL — ABNORMAL LOW (ref 150–400)
RBC: 3.81 MIL/uL — ABNORMAL LOW (ref 3.87–5.11)
RDW: 13.5 % (ref 11.5–15.5)
WBC: 9.3 10*3/uL (ref 4.0–10.5)

## 2017-08-18 LAB — ECHOCARDIOGRAM COMPLETE
HEIGHTINCHES: 67 in
WEIGHTICAEL: 2924.18 [oz_av]

## 2017-08-18 LAB — HEMOGLOBIN A1C
Hgb A1c MFr Bld: 5.8 % — ABNORMAL HIGH (ref 4.8–5.6)
Mean Plasma Glucose: 119.76 mg/dL

## 2017-08-18 LAB — RPR: RPR: NONREACTIVE

## 2017-08-18 LAB — TSH: TSH: 2.758 u[IU]/mL (ref 0.350–4.500)

## 2017-08-18 MED ORDER — IBUPROFEN 200 MG PO TABS
400.0000 mg | ORAL_TABLET | Freq: Four times a day (QID) | ORAL | Status: DC | PRN
  Administered 2017-08-18: 400 mg via ORAL
  Filled 2017-08-18: qty 2

## 2017-08-18 MED ORDER — IBUPROFEN 200 MG PO TABS
400.0000 mg | ORAL_TABLET | ORAL | Status: AC
  Administered 2017-08-18: 400 mg via ORAL
  Filled 2017-08-18: qty 2

## 2017-08-18 MED ORDER — LEVETIRACETAM 500 MG PO TABS
500.0000 mg | ORAL_TABLET | Freq: Two times a day (BID) | ORAL | Status: DC
  Administered 2017-08-18 – 2017-08-19 (×3): 500 mg via ORAL
  Filled 2017-08-18 (×3): qty 1

## 2017-08-18 MED ORDER — POTASSIUM CHLORIDE CRYS ER 20 MEQ PO TBCR
20.0000 meq | EXTENDED_RELEASE_TABLET | Freq: Once | ORAL | Status: AC
  Administered 2017-08-18: 20 meq via ORAL
  Filled 2017-08-18: qty 1

## 2017-08-18 MED ORDER — POLYETHYLENE GLYCOL 3350 17 G PO PACK
17.0000 g | PACK | Freq: Every day | ORAL | Status: DC | PRN
  Administered 2017-08-18: 17 g via ORAL
  Filled 2017-08-18: qty 1

## 2017-08-18 NOTE — Progress Notes (Signed)
Called patient's outpatient neurologist, Dr. Gordy SaversMichael Amir with [facility]. Did not reach anyone. Left message to call back regarding patient's baseline neurologic status and to discuss current inpatient work up for dementia.

## 2017-08-18 NOTE — Progress Notes (Signed)
Family Medicine Teaching Service Daily Progress Note Intern Pager: [phone]  Patient name: Adrienne Jackson Medical record number: [id number] Date of birth: 02/01/1941 Age: 76 y.o. Gender: female  Primary Care Provider: Patient, No Pcp Per Consultants: Neurology  Code Status: Full  Pt Overview and Major Events to Date:  Adrienne Jackson is a 76 y.o. female presenting with repeat syncopal events. PMH is significant for HTN, HLD, and hypothyroidism.  Assessment and Plan: #Episodes of decreased consciousness and aphasia: Concern for stroke vs cardiac syncopy. CT head, CTA neck, MRI brain. Negative for acute stroke. MRI brain showed left temporal lobe atrophy concerning for progressive dementia. EEG pending. Echo pending. Orthostatics wnl. Pt has significant murmer. Concern for valvular disease as cause. Cont Keppra per neuro. TSH wnl  A1C and lipids pending. Reversible dementia work up RPR, B12 pending. Swallow eval pending. Urine culture pending. PT/OT eval pending.  - Cardiology consult appreciated.  - Neurology recommendations appreciated. - q4h neuro checks  #HTN: Slightly hypertensive to SBP of 152. home medications include lisinopril 5 mg, Did have bradycardic episode to 40s on ED.  bisoprolol-hctz 5-6.25 mg; not taken today - continue lisinopril 5 mg - hold beta blocker in setting of bradycardia earlier today  #Right wrist pain: S/p falling earlier today. Mild soft tissue swelling on xray and chondrocalcinosis concerning for psuedogout.  - Ice prn - tylenol prn  FEN/GI: NPO until swallow screen then heart healthy Prophylaxis: lovenox  Disposition: pending syncope work-up  Subjective:  Feels well. Wants to comb her hair after the EEG. No complaints. Feels speech is better. Son pulled me aside outside of the room and is concerned about anxiety she has outside of the hospital in her regular life. Her mood seems appropriate and upbeat today.   Objective: Temp:  [98.5 F  (36.9 C)-99.5 F (37.5 C)] 99.5 F (37.5 C) (11/23 0400) Pulse Rate:  [48-80] 80 (11/23 0400) Resp:  [14-23] 20 (11/23 0400) BP: (118-158)/(51-71) 130/59 (11/23 0400) SpO2:  [95 %-100 %] 95 % (11/23 0400) Weight:  [182 lb 12.2 oz (82.9 kg)] 182 lb 12.2 oz (82.9 kg) (11/22 2200) Physical Exam: General: NAD, standing at bedside Cardiovascular: rrr, Grade III-VI murmer Respiratory: CTAB, normal work of breathing Abdomen: soft, nontender, nondistended Neuro: right wrist mildly swollen and warm, III/V grip strength on right limited due to pain. 5/5 strength throughout all other extremities. Sensation is intact.   Laboratory: Recent Labs  Lab 08/17/17 0825 08/17/17 1759 08/18/17 0247  WBC 9.4  --  9.3  HGB 12.8 11.9* 12.2  HCT 38.2 35.0* 36.8  PLT 138*  --  134*   Recent Labs  Lab 08/17/17 0825 08/17/17 1759 08/18/17 0247  NA 136 136 136  K 4.0 3.6 3.4*  CL 104 101 104  CO2 24  --  26  BUN 9 11 8   CREATININE 0.84 0.70 0.73  CALCIUM 8.8*  --  8.8*  GLUCOSE 170* 176* 114*     Garnette Gunnerhompson, Elleana Stillson B, MD 08/18/2017, 8:17 AM PGY-1, [facility] FPTS Intern pager: [phone], text pages welcome

## 2017-08-18 NOTE — Progress Notes (Signed)
EEG tracings were normal in the awake and sleep states. Although there are no ongoing electrographic seizures, this does not rule out underlying epilepsy.    No change to initial neurology assessment and plan as documented in 11/22 consult note.   Please call if there are additional questions.   Electronically signed: Dr. Caryl PinaEric Oviya Ammar

## 2017-08-18 NOTE — Evaluation (Signed)
Physical Therapy Evaluation Patient Details Name: Adrienne Jackson MRN: [id number] DOB: 03/06/1941 Today's Date: 08/18/2017   History of Present Illness  76 year old female with history of hypertension, hyperlipidemia and memory impairment according to her son presents with 2 episodes of passing out followed by confusion and difficulty speech and comprehension. Patient had a passing out episode 6 months ago CT head showed no acute abnormality and MRI brain was performed which showed left temporal atrophy. Patient also found to be bradycardic in the emergency room.  Clinical Impression  Pt reports she was independent with all mobility and ADLs including driving and cutting the grass. Currently pt overall min guard for all mobility, however pt presents with impaired cognition and speech deficits. D/c plans per son are for pt to live with sister who will provide 24 hr supervision. PT recommending HHPT and 24/7 supervision to ensure safety with her functional mobility. PT will continue to follow pt acutely to address safety concerns.     Follow Up Recommendations Home health PT;Supervision/Assistance - 24 hour    Equipment Recommendations  None recommended by PT    Recommendations for Other Services       Precautions / Restrictions Precautions Precautions: Fall Restrictions Weight Bearing Restrictions: No      Mobility  Bed Mobility Overal bed mobility: Needs Assistance Bed Mobility: Supine to Sit     Supine to sit: Supervision;HOB elevated     General bed mobility comments: supervision for safety, no physical assist required  Transfers Overall transfer level: Needs assistance Equipment used: None Transfers: Sit to/from Stand Sit to Stand: Min guard         General transfer comment: min guard for safety due to c/o RLE pain  Ambulation/Gait Ambulation/Gait assistance: Min guard Ambulation Distance (Feet): 150 Feet Assistive device: None Gait Pattern/deviations:  Step-through pattern;Decreased weight shift to right;Antalgic Gait velocity: slowed Gait velocity interpretation: Below normal speed for age/gender General Gait Details: min guard for safety, slightly antalgic gait on R side states she has some R hip pain but unable to determine when it started or what its origin is     Balance Overall balance assessment: Needs assistance Sitting-balance support: Feet supported;No upper extremity supported Sitting balance-Leahy Scale: Good     Standing balance support: No upper extremity supported;During functional activity Standing balance-Leahy Scale: Good                               Pertinent Vitals/Pain Pain Assessment: Faces Faces Pain Scale: Hurts even more Pain Location: R wrist, RLE Pain Descriptors / Indicators: Aching;Sore Pain Intervention(s): Monitored during session;Repositioned    Home Living Family/patient expects to be discharged to:: Private residence Living Arrangements: Alone Available Help at Discharge: Other (Comment)(to be determined) Type of Home: House Home Access: Stairs to enter Entrance Stairs-Rails: Right Entrance Stairs-Number of Steps: 5 Home Layout: Two level;Bed/bath upstairs Home Equipment: Shower seat      Prior Function Level of Independence: Independent         Comments: driving, grocery shopping     Hand Dominance   Dominant Hand: Right    Extremity/Trunk Assessment   Upper Extremity Assessment Upper Extremity Assessment: Defer to OT evaluation RUE Deficits / Details: limited ROM in digits or wrist due to pain, increasesd edema and reddness noted at wrist RUE: Unable to fully assess due to pain    Lower Extremity Assessment Lower Extremity Assessment: Overall WFL for tasks assessed    Cervical /  Trunk Assessment Cervical / Trunk Assessment: Normal  Communication   Communication: Expressive difficulties  Cognition Arousal/Alertness: Awake/alert Behavior During Therapy:  WFL for tasks assessed/performed Overall Cognitive Status: Impaired/Different from baseline Area of Impairment: Orientation;Memory;Following commands;Safety/judgement;Problem solving                 Orientation Level: Disoriented to;Place;Situation   Memory: Decreased short-term memory Following Commands: Follows one step commands consistently Safety/Judgement: Decreased awareness of safety;Decreased awareness of deficits   Problem Solving: Decreased initiation;Difficulty sequencing;Requires verbal cues;Requires tactile cues        General Comments General comments (skin integrity, edema, etc.): pt is room at entry and left for evaluation as he feels she relies on him to answer questions for her to mask her deficits    Exercises     Assessment/Plan    PT Assessment Patient needs continued PT services  PT Problem List Decreased safety awareness;Decreased cognition       PT Treatment Interventions Gait training;Stair training;Functional mobility training;Therapeutic activities;Therapeutic exercise;Balance training;Cognitive remediation;Patient/family education    PT Goals (Current goals can be found in the Care Plan section)  Acute Rehab PT Goals Patient Stated Goal: return home PT Goal Formulation: With patient Time For Goal Achievement: 09/01/17 Potential to Achieve Goals: Good    Frequency Min 3X/week   Barriers to discharge Decreased caregiver support      Co-evaluation PT/OT/SLP Co-Evaluation/Treatment: Yes Reason for Co-Treatment: Necessary to address cognition/behavior during functional activity PT goals addressed during session: Mobility/safety with mobility OT goals addressed during session: ADL's and self-care       AM-PAC PT "6 Clicks" Daily Activity  Outcome Measure Difficulty turning over in bed (including adjusting bedclothes, sheets and blankets)?: A Little Difficulty moving from lying on back to sitting on the side of the bed? : A  Little Difficulty sitting down on and standing up from a chair with arms (e.g., wheelchair, bedside commode, etc,.)?: A Little Help needed moving to and from a bed to chair (including a wheelchair)?: None Help needed walking in hospital room?: None Help needed climbing 3-5 steps with a railing? : A Little 6 Click Score: 20    End of Session Equipment Utilized During Treatment: Gait belt Activity Tolerance: Patient tolerated treatment well Patient left: in chair;with call bell/phone within reach;with chair alarm set;with family/visitor present Nurse Communication: Mobility status PT Visit Diagnosis: Other abnormalities of gait and mobility (R26.89)    Time: 1400-1431 PT Time Calculation (min) (ACUTE ONLY): 31 min   Charges:   PT Evaluation $PT Eval Moderate Complexity: 1 Mod     PT G Codes:        Delante Karapetyan B. Beverely RisenVan Fleet PT, DPT Acute Rehabilitation  [phone] Pager [phone]    Adrienne Jackson 08/18/2017, 3:44 PM

## 2017-08-18 NOTE — Progress Notes (Signed)
  Echocardiogram 2D Echocardiogram has been performed.  Roosvelt MaserLane, Brinson Tozzi F 08/18/2017, 12:13 PM

## 2017-08-18 NOTE — Plan of Care (Signed)
  Progressing Education: Knowledge of General Education information will improve 08/18/2017 2335 - Progressing by Lennice Sitesepe, Leather Estis N, RN 08/18/2017 2334 - Progressing by Lennice Sitesepe, Eitan Doubleday N, RN Health Behavior/Discharge Planning: Ability to manage health-related needs will improve 08/18/2017 2335 - Progressing by Lennice Sitesepe, Laine Giovanetti N, RN 08/18/2017 2334 - Progressing by Lennice Sitesepe, Amzie Sillas N, RN Clinical Measurements: Ability to maintain clinical measurements within normal limits will improve 08/18/2017 2335 - Progressing by Lennice Sitesepe, Rosaleah Person N, RN 08/18/2017 2334 - Progressing by Lennice Sitesepe, Jonathon Tan N, RN Will remain free from infection 08/18/2017 2335 - Progressing by Lennice Sitesepe, Lanessa Shill N, RN 08/18/2017 2334 - Progressing by Lennice Sitesepe, Michalene Debruler N, RN Diagnostic test results will improve 08/18/2017 2335 - Progressing by Lennice Sitesepe, Kamuela Magos N, RN 08/18/2017 2334 - Progressing by Lennice Sitesepe, Lorie Melichar N, RN Respiratory complications will improve 08/18/2017 2335 - Progressing by Lennice Sitesepe, Rashiya Lofland N, RN 08/18/2017 2334 - Progressing by Lennice Sitesepe, Jazira Maloney N, RN Cardiovascular complication will be avoided 08/18/2017 2335 - Progressing by Lennice Sitesepe, Jimi Schappert N, RN 08/18/2017 2334 - Progressing by Lennice Sitesepe, Roney Youtz N, RN Activity: Risk for activity intolerance will decrease 08/18/2017 2335 - Progressing by Lennice Sitesepe, Efrain Clauson N, RN 08/18/2017 2334 - Progressing by Lennice Sitesepe, Aamya Orellana N, RN Nutrition: Adequate nutrition will be maintained 08/18/2017 2335 - Progressing by Lennice Sitesepe, Chloris Marcoux N, RN 08/18/2017 2334 - Progressing by Lennice Sitesepe, Ayeshia Coppin N, RN Coping: Level of anxiety will decrease 08/18/2017 2335 - Progressing by Lennice Sitesepe, Olaf Mesa N, RN 08/18/2017 2334 - Progressing by Lennice Sitesepe, Jarae Panas N, RN Safety: Ability to remain free from injury will improve 08/18/2017 2335 - Progressing by Lennice Sitesepe, Eulice Rutledge N, RN 08/18/2017 2334 - Progressing by Lennice Sitesepe, Kara Mierzejewski N, RN Skin Integrity: Risk for impaired skin integrity will decrease 08/18/2017 2335 - Progressing by Lennice Sitesepe, Ia Leeb N, RN 08/18/2017 2334 -  Progressing by Lennice Sitesepe, Danese Dorsainvil N, RN Education: Knowledge of disease or condition will improve 08/18/2017 2335 - Progressing by Lennice Sitesepe, Judie Hollick N, RN 08/18/2017 2334 - Progressing by Lennice Sitesepe, Maddeline Roorda N, RN Knowledge of secondary prevention will improve 08/18/2017 2335 - Progressing by Lennice Sitesepe, Annagrace Carr N, RN 08/18/2017 2334 - Progressing by Lennice Sitesepe, Jusitn Salsgiver N, RN Self-Care: Ability to participate in self-care as condition permits will improve 08/18/2017 2335 - Progressing by Lennice Sitesepe, Seeley Southgate N, RN 08/18/2017 2334 - Progressing by Lennice Sitesepe, Demyah Smyre N, RN Ability to communicate needs accurately will improve 08/18/2017 2335 - Progressing by Lennice Sitesepe, Malayja Freund N, RN 08/18/2017 2334 - Progressing by Lennice Sitesepe, Verdine Grenfell N, RN Ischemic Stroke/TIA Tissue Perfusion: Complications of ischemic stroke/TIA will be minimized 08/18/2017 2335 - Progressing by Lennice Sitesepe, Mitul Hallowell N, RN 08/18/2017 2334 - Progressing by Lennice Sitesepe, Micheline Markes N, RN

## 2017-08-18 NOTE — Evaluation (Signed)
Occupational Therapy Evaluation Patient Details Name: Adrienne ConstableCatherine Jackson MRN: [id number] DOB: 11/17/1940 Today's Date: 08/18/2017    History of Present Illness 76 year old female with history of hypertension, hyperlipidemia and memory impairment according to her son presents with 2 episodes of passing out followed by confusion and difficulty speech and comprehension. Patient had a passing out episode 6 months ago CT head showed no acute abnormality and MRI brain was performed which showed left temporal atrophy. Patient also found to be bradycardic in the emergency room.   Clinical Impression   Pt reports she was independent with ADL PTA. Currently pt overall min assist with ADL and min guard for functional mobility due to right wrist and hip pain. Pt presenting with impaired cognition and speech deficits. Pt planning to d/c home with family supervision. Recommending HHOT and 24/7 supervision to maximize independence and safety with ADL and functional mobility upon return home. Pt would benefit from continued skilled OT to address established goals.    Follow Up Recommendations  Home health OT;Supervision/Assistance - 24 hour    Equipment Recommendations  None recommended by OT    Recommendations for Other Services       Precautions / Restrictions Precautions Precautions: Fall Restrictions Weight Bearing Restrictions: No      Mobility Bed Mobility Overal bed mobility: Needs Assistance Bed Mobility: Supine to Sit     Supine to sit: Supervision;HOB elevated     General bed mobility comments: supervision for safety, no physical assist required  Transfers Overall transfer level: Needs assistance Equipment used: None Transfers: Sit to/from Stand Sit to Stand: Min guard         General transfer comment: min guard for safety due to c/o RLE pain    Balance Overall balance assessment: Needs assistance Sitting-balance support: Feet supported;No upper extremity  supported Sitting balance-Leahy Scale: Good     Standing balance support: No upper extremity supported;During functional activity Standing balance-Leahy Scale: Good                             ADL either performed or assessed with clinical judgement   ADL Overall ADL's : Needs assistance/impaired Eating/Feeding: Minimal assistance;Sitting   Grooming: Minimal assistance;Sitting   Upper Body Bathing: Minimal assistance;Sitting   Lower Body Bathing: Minimal assistance;Sit to/from stand   Upper Body Dressing : Minimal assistance;Sitting   Lower Body Dressing: Minimal assistance;Sit to/from stand   Toilet Transfer: Min guard;Ambulation;Regular Teacher, adult educationToilet Toilet Transfer Details (indicate cue type and reason): Simulated by sit to stand from EOB with functional mobiltiy         Functional mobility during ADLs: Min guard       Vision         Perception     Praxis      Pertinent Vitals/Pain Pain Assessment: Faces Faces Pain Scale: Hurts even more Pain Location: R wrist, RLE Pain Descriptors / Indicators: Aching;Sore Pain Intervention(s): Monitored during session;Repositioned;Ice applied     Hand Dominance Right   Extremity/Trunk Assessment Upper Extremity Assessment Upper Extremity Assessment: RUE deficits/detail RUE Deficits / Details: limited ROM in digits or wrist due to pain, increasesd edema and reddness noted at wrist RUE: Unable to fully assess due to pain   Lower Extremity Assessment Lower Extremity Assessment: Defer to PT evaluation   Cervical / Trunk Assessment Cervical / Trunk Assessment: Normal   Communication Communication Communication: Expressive difficulties   Cognition Arousal/Alertness: Awake/alert Behavior During Therapy: WFL for tasks assessed/performed Overall Cognitive Status:  Impaired/Different from baseline Area of Impairment: Orientation;Memory;Following commands;Safety/judgement;Problem solving                  Orientation Level: Disoriented to;Place;Situation   Memory: Decreased short-term memory Following Commands: Follows one step commands consistently Safety/Judgement: Decreased awareness of safety;Decreased awareness of deficits   Problem Solving: Decreased initiation;Difficulty sequencing;Requires verbal cues;Requires tactile cues     General Comments       Exercises     Shoulder Instructions      Home Living Family/patient expects to be discharged to:: Private residence Living Arrangements: Alone Available Help at Discharge: Other (Comment)(to be determined) Type of Home: House Home Access: Stairs to enter Entergy CorporationEntrance Stairs-Number of Steps: 5 Entrance Stairs-Rails: Right Home Layout: Two level;Bed/bath upstairs     Bathroom Shower/Tub: Tub/shower unit;Curtain   FirefighterBathroom Toilet: Standard Bathroom Accessibility: Yes   Home Equipment: Shower seat      Lives With: Alone    Prior Functioning/Environment Level of Independence: Independent        Comments: driving, grocery shopping        OT Problem List: Decreased strength;Impaired balance (sitting and/or standing);Decreased coordination;Decreased cognition;Decreased safety awareness;Pain;Increased edema;Impaired UE functional use      OT Treatment/Interventions: Self-care/ADL training;Therapeutic exercise;Energy conservation;DME and/or AE instruction;Therapeutic activities;Cognitive remediation/compensation;Patient/family education;Balance training    OT Goals(Current goals can be found in the care plan section) Acute Rehab OT Goals Patient Stated Goal: return home OT Goal Formulation: With patient Time For Goal Achievement: 09/01/17 Potential to Achieve Goals: Good ADL Goals Pt Will Perform Grooming: with modified independence;standing Pt Will Perform Upper Body Bathing: with modified independence;sitting;standing Pt Will Perform Lower Body Bathing: with modified independence;sit to/from stand Pt Will Transfer  to Toilet: with modified independence;ambulating;regular height toilet Pt Will Perform Toileting - Clothing Manipulation and hygiene: with modified independence;sit to/from stand Pt Will Perform Tub/Shower Transfer: with modified independence;Tub transfer;ambulating;shower seat  OT Frequency: Min 2X/week   Barriers to D/C: Decreased caregiver support;Inaccessible home environment  pt lives alone in 2 story home       Co-evaluation PT/OT/SLP Co-Evaluation/Treatment: Yes Reason for Co-Treatment: Necessary to address cognition/behavior during functional activity   OT goals addressed during session: ADL's and self-care      AM-PAC PT "6 Clicks" Daily Activity     Outcome Measure Help from another person eating meals?: A Little Help from another person taking care of personal grooming?: A Little Help from another person toileting, which includes using toliet, bedpan, or urinal?: A Little Help from another person bathing (including washing, rinsing, drying)?: A Little Help from another person to put on and taking off regular upper body clothing?: A Little Help from another person to put on and taking off regular lower body clothing?: A Little 6 Click Score: 18   End of Session Equipment Utilized During Treatment: Gait belt  Activity Tolerance: Patient tolerated treatment well Patient left: in chair;with call bell/phone within reach;with chair alarm set  OT Visit Diagnosis: Pain;Cognitive communication deficit (R41.841) Pain - Right/Left: Right Pain - part of body: Hand                Time: [id number] OT Time Calculation (min): 26 min Charges:  OT General Charges $OT Visit: 1 Visit OT Evaluation $OT Eval Moderate Complexity: 1 Mod G-Codes:     Ferrell Claiborne A. Brett Albinooffey, M.S., OTR/L Pager: [phone]  Gaye AlkenBailey A Bahar Shelden 08/18/2017, 2:49 PM

## 2017-08-18 NOTE — Progress Notes (Addendum)
Pt 76 yr old  white   female admitted from ED to [facility] with sypcopal episode x 2 toady.[facility]  early this morning, but  came back due to another episode of syncope this pm.  Pt/ and son at bedside denied a fall during the episode,. Pt alert  Oriented x  4  MAE, slight  weakness  noted  With intermittent dysarthria. Pt /son  Oriented to unit and plan of care reviewed  And explained.. Both verbalized understanding. Cardiac monitor in use. Call bell within reach.Pt denied any SOB, dizziness , lightheadedness or Pain during assessment. RN Will continue to monitor.

## 2017-08-18 NOTE — Discharge Summary (Signed)
Family Medicine [facility] Discharge Summary  Patient name: Adrienne Jackson Medical record number: [id number] Date of birth: 06/23/1941 Age: 76 y.o. Gender: female Date of Admission: 08/17/2017  Date of Discharge: 08/19/2017 Admitting Physician: Leighton Roachodd D McDiarmid, MD  Primary Care Provider: Patient, No Pcp Per Consultants: Cardiology, Neurology  Indication for Hospitalization: Syncope/Seizure work up  Discharge Diagnoses/Problem List:  H/o episodes of decreased consciousness and aphasia HTN Right wrist pain  Disposition: [facility]  Discharge Condition: Stable  Discharge Exam: General: NAD, sitting in bed Cardiovascular: RRR, systolic murmur, no LE edema Respiratory: CTAB, normal work of breathing, no wheezes/crackles Abdomen: soft, nontender, nondistended Neuro: right wrist mildly swollen and warm, 3/5 grip strength on right hand limited due to swelling, 3/5 RUE strength. 5/5 strength throughout all other extremities. Sensation is intact.   Brief Hospital Course:  Patient was admitted for syncope and work up completed for seizure, stroke, and cardiac causes. MRI brain showed asymmetric left temporal lobe atrophy, a pattern seen with semantic dementia. EEG was normal. Neurology was consulted who started an anti-seizure medication, Keppra, and recommended further outpatient evaluation for dementia. RPR was negative. TSH wnl. Hg A1C 5.8. CT Head/CTA Head neck showed no acute changes concerning for stroke.  Echocardiogram showed moderate mitral valve stenosis and EF 60-65% with no wall motion abnormatlities. Cardiology but did not feel that the mitral stenosis was the cause of syncope. Cardiology was consulted who recommended a 30 day event monitor outpatient to assess for any arrhythmias causing syncope episodes.You should make an appointment to follow up with Dr. Danella DeisKelling as soon as available. Patient was also complaining of right wrist pain and swelling on  admission. Right wrist xray showed linear chondrocalcinosis concerning for pseudogout. Pain improved with NSAIDs and ice.   Seizure precautions: Per [place] statutes, patients with seizures are not allowed to drive until they have been seizure-free for six months. Use caution when using heavy equipment or power tools. Avoid working on ladders or at heights. Take showers instead of baths. Ensure the water temperature is not too high on the home water heater. Do not go swimming alone. Do not lock yourself in a room alone (i.e. bathroom). When caring for infants or small children, sit down when holding, feeding, or changing them to minimize risk of injury to the child in the event you have a seizure. Maintain good sleep hygiene. Avoid alcohol.   Issues for Follow Up:  1. neuropsych evaluation to be done as an outpatient to further evaluate demenita, can be done by previous neurologist 2. Started on keppra 3. Patient to get 30 day outpatient event monitor, ensure she is following up with Cardiology to obtain this. 4. Held bisoprolol-hctz due to low normal Bps 5. Held beta blocker due to bradycardia 6. [facility] with recommended 24 hour supervision. 7. Will be getting outpatient SLP 8. Consider joint aspiration to confirm diagnosis of pseudogout. Medically manage as necessary.  9. Urine culture had MULTIPLE SPECIES PRESENT, SUGGEST RECOLLECTION.   Significant Procedures: None  Significant Labs and Imaging:  Recent Labs  Lab 08/17/17 0825 08/17/17 1759 08/18/17 0247 08/19/17 1056  WBC 9.4  --  9.3 6.8  HGB 12.8 11.9* 12.2 12.2  HCT 38.2 35.0* 36.8 37.4  PLT 138*  --  134* 143*   Recent Labs  Lab 08/17/17 0825 08/17/17 1759 08/18/17 0247 08/19/17 1056  NA 136 136 136 135  K 4.0 3.6 3.4* 4.1  CL 104 101 104 104  CO2 24  --  26 25  GLUCOSE 170* 176* 114* 99  BUN 9 11 8 11   CREATININE 0.84 0.70 0.73 0.77  CALCIUM 8.8*  --  8.8* 8.5*    Ct Angio Head W Or  Wo Contrast  Result Date: 08/17/2017 CLINICAL DATA:  Focal neuro deficit. Stroke suspected. Per previous history there is altered level of consciousness of multiple syncopal episodes today. EXAM: CT ANGIOGRAPHY HEAD AND NECK TECHNIQUE: Multidetector CT imaging of the head and neck was performed using the standard protocol during bolus administration of intravenous contrast. Multiplanar CT image reconstructions and MIPs were obtained to evaluate the vascular anatomy. Carotid stenosis measurements (when applicable) are obtained utilizing NASCET criteria, using the distal internal carotid diameter as the denominator. CONTRAST:  50 cc Isovue 370 intravenous COMPARISON:  Head CT from earlier today FINDINGS: CTA NECK FINDINGS Aortic arch: Partially covered arch that is atheromatous. Brachiocephalic and complete left common carotid origins are not covered. Right carotid system: Minimal atheromatous changes. No stenosis or ulceration. Left carotid system: Mild atheromatous changes at the common carotid bifurcation and origin. No stenosis or ulceration. Vertebral arteries: No proximal subclavian stenosis. Mild right vertebral artery dominance. Streak artifact affects visualization of vessels at the level of the oral cavity. No flow limiting stenosis or dissection is suspected. Skeleton: Facet arthropathy and disc degeneration. No acute osseous finding. Other neck: No incidental mass or adenopathy. Upper chest: No acute finding Review of the MIP images confirms the above findings CTA HEAD FINDINGS Anterior circulation: Atherosclerotic plaque on the carotid siphons. No flow limiting stenosis or ulceration. Negative for branch occlusion. Negative for aneurysm. Posterior circulation: Mild right vertebral artery dominance. No branch occlusion, flow limiting stenosis, or aneurysm. Mild atheromatous type narrowing of the mid right P2 segment Venous sinuses: Patent Anatomic variants: None significant Delayed phase: No abnormal  intracranial enhancement. Review of the MIP images confirms the above findings IMPRESSION: 1. No acute finding. 2. Mild atherosclerosis without flow limiting stenosis in the head or neck. Electronically Signed   By: Marnee Spring M.D.   On: 08/17/2017 21:09   Dg Wrist Complete Right  Result Date: 08/17/2017 CLINICAL DATA:  Right wrist pain and swelling. EXAM: RIGHT WRIST - COMPLETE 3+ VIEW COMPARISON:  None. FINDINGS: No acute fracture or malalignment. Moderate first CMC and mild triscaphe degenerative changes. Chondrocalcinosis of the TFCC. Osteopenia. Mild soft tissue swelling about the wrist. IMPRESSION: 1. Mild soft tissue swelling about the wrist. No acute osseous abnormality. 2. Chondrocalcinosis of the TFCC, as can be seen with CPPD arthropathy. 3. Moderate first CMC joint degenerative changes. Electronically Signed   By: Obie Dredge M.D.   On: 08/17/2017 11:50   Ct Head Wo Contrast  Result Date: 08/17/2017 CLINICAL DATA:  Altered level of consciousness. Multiple syncopal episodes today. Slight headache. EXAM: CT HEAD WITHOUT CONTRAST TECHNIQUE: Contiguous axial images were obtained from the base of the skull through the vertex without intravenous contrast. COMPARISON:  None. FINDINGS: Brain: No evidence of acute infarction, hemorrhage, hydrocephalus, extra-axial collection or mass lesion/mass effect. Brain volume is overall normal for age but there is asymmetric left temporal atrophy and sulcal widening. This is of indeterminate significance in the absence of dementia/ memory loss history. Vascular: No hyperdense vessel or unexpected calcification. Skull: Normal. Negative for fracture or focal lesion. Sinuses/Orbits: Unremarkable IMPRESSION: No acute finding. Electronically Signed   By: Marnee Spring M.D.   On: 08/17/2017 18:47   Ct Angio Neck W And/or Wo Contrast  Result Date: 08/17/2017  CLINICAL DATA:  Focal neuro deficit. Stroke suspected. Per previous history there is altered level  of consciousness of multiple syncopal episodes today. EXAM: CT ANGIOGRAPHY HEAD AND NECK TECHNIQUE: Multidetector CT imaging of the head and neck was performed using the standard protocol during bolus administration of intravenous contrast. Multiplanar CT image reconstructions and MIPs were obtained to evaluate the vascular anatomy. Carotid stenosis measurements (when applicable) are obtained utilizing NASCET criteria, using the distal internal carotid diameter as the denominator. CONTRAST:  50 cc Isovue 370 intravenous COMPARISON:  Head CT from earlier today FINDINGS: CTA NECK FINDINGS Aortic arch: Partially covered arch that is atheromatous. Brachiocephalic and complete left common carotid origins are not covered. Right carotid system: Minimal atheromatous changes. No stenosis or ulceration. Left carotid system: Mild atheromatous changes at the common carotid bifurcation and origin. No stenosis or ulceration. Vertebral arteries: No proximal subclavian stenosis. Mild right vertebral artery dominance. Streak artifact affects visualization of vessels at the level of the oral cavity. No flow limiting stenosis or dissection is suspected. Skeleton: Facet arthropathy and disc degeneration. No acute osseous finding. Other neck: No incidental mass or adenopathy. Upper chest: No acute finding Review of the MIP images confirms the above findings CTA HEAD FINDINGS Anterior circulation: Atherosclerotic plaque on the carotid siphons. No flow limiting stenosis or ulceration. Negative for branch occlusion. Negative for aneurysm. Posterior circulation: Mild right vertebral artery dominance. No branch occlusion, flow limiting stenosis, or aneurysm. Mild atheromatous type narrowing of the mid right P2 segment Venous sinuses: Patent Anatomic variants: None significant Delayed phase: No abnormal intracranial enhancement. Review of the MIP images confirms the above findings IMPRESSION: 1. No acute finding. 2. Mild atherosclerosis  without flow limiting stenosis in the head or neck. Electronically Signed   By: Marnee Spring M.D.   On: 08/17/2017 21:09   Mr Brain Wo Contrast  Result Date: 08/17/2017 CLINICAL DATA:  Unexplained altered level of consciousness. EXAM: MRI HEAD WITHOUT CONTRAST TECHNIQUE: Multiplanar, multiecho pulse sequences of the brain and surrounding structures were obtained without intravenous contrast. COMPARISON:  CT head and CTA head neck from earlier the same day. FINDINGS: Brain: No acute infarction, hemorrhage, hydrocephalus, extra-axial collection or mass lesion. There is notable asymmetric left temporal lobe atrophy with sulcal widening and hippocampus thinning. Elsewhere brain volume is unremarkable for age. Per notes, the patient does have a history of dementia. Normal white matter appearance. Vascular: Major flow voids are preserved. Skull and upper cervical spine: Cervical disc and facet degeneration. Sinuses/Orbits: Negative IMPRESSION: 1. No acute finding. 2. Asymmetric left temporal lobe atrophy, a pattern seen with semantic dementia. Electronically Signed   By: Marnee Spring M.D.   On: 08/17/2017 21:53   Echocardiogram (TTE) Date: 08/18/2017 Study Conclusions - Left ventricle: The cavity size was normal. Wall thickness was   increased in a pattern of mild LVH. Systolic function was normal.   The estimated ejection fraction was in the range of 60% to 65%.   Left ventricular diastolic function parameters were normal. - Aortic valve: There was moderate stenosis. Valve area (VTI): 1.52   cm^2. Valve area (Vmax): 1.63 cm^2. Valve area (Vmean): 1.37   cm^2. - Left atrium: The atrium was mildly dilated. - Atrial septum: A patent foramen ovale cannot be excluded.  Results/Tests Pending at Time of Discharge: None  Discharge Medications:  Allergies as of 08/19/2017   No Known Allergies     Medication List    STOP taking these medications   bisoprolol-hydrochlorothiazide 5-6.25 MG  tablet Commonly  known as:  ZIAC     TAKE these medications   aspirin EC 81 MG tablet Take 81 mg by mouth daily.   hydrochlorothiazide 12.5 MG capsule Commonly known as:  MICROZIDE Take 1 capsule (12.5 mg total) by mouth daily.   levETIRAcetam 500 MG tablet Commonly known as:  KEPPRA Take 1 tablet (500 mg total) by mouth 2 (two) times daily.   levothyroxine 75 MCG tablet Commonly known as:  SYNTHROID, LEVOTHROID Take 75 mcg by mouth daily before breakfast.   lisinopril 5 MG tablet Commonly known as:  PRINIVIL,ZESTRIL Take 5 mg by mouth daily.   rosuvastatin 20 MG tablet Commonly known as:  CRESTOR Take 20 mg by mouth daily.       Discharge Instructions: Please refer to Patient Instructions section of EMR for full details.  Patient was counseled important signs and symptoms that should prompt return to medical care, changes in medications, dietary instructions, activity restrictions, and follow up appointments.   Follow-Up Appointments: Follow-up Information    Loa SocksDouglas Kelling,MD. Schedule an appointment as soon as possible for a visit.   Contact information: Address: [address], [place], [place] [address] Phone: [phone] Fax: [phone]       Health, [facility]-Home Follow up.   Specialty:  Home Health Services Why:  Physical Therapy, Occupational Therapy, Speech Therapy Contact information: [address] [place] [address] [phone]           Garnette Gunnerhompson, Ryu Cerreta B, MD 08/20/2017, 10:54 AM PGY-1, [facility]

## 2017-08-18 NOTE — Procedures (Signed)
EEG Report  Clinical History:  Syncope x 2.   Technical Summary:  A 19 channel digital EEG recording was performed using the 10-20 international system of electrode placement.  Bipolar and Referential montages were used.  The total recording time was approx 20 minutes.  Findings:  There is a posterior dominant rhythm of 9 Hz reactive to eye opening and closure.  No focal slowing is present.  Photic stimulation produces a symmetrical driving response and does not elicit any abnormalities.  Hyperventilation was not performed.  She gets drowsy and enters into stage N2 sleep as evidenced by vertex sharp waves and sleep spindles.  No abnormalities are elicited during sleep.  Throughout the recording, there are no epileptiform discharges or electrographic seizures present.    Impression:  This is a normal EEG in the awake and sleep states.  However, this does not rule out underlying epilepsy.  If clinical suspicion remains high, then a more prolonged EEG may be of additional value.     Adrienne SettleShervin Imoni Kohen, MS, MD

## 2017-08-18 NOTE — Consult Note (Signed)
Cardiology Consult    Patient ID: Adrienne Jackson MRN: [id number], DOB/AGE: January 11, 1941   Admit date: 08/17/2017 Date of Consult: 08/18/2017  Primary Physician: Patient, No Pcp Per Primary Cardiologist: Vilinda Boehringer, [place]  Requesting Provider: Dr. Janee Morn  Reason for Consult: syncope  Patient Profile   Adrienne Jackson is a 76 y.o. female who is being seen today for the evaluation of syncope at the request of Dr. Janee Morn. She has a PMH of HTN, HLD, hypothyroidism and a known heart murmur.  Past Medical History   Past Medical History:  Diagnosis Date  . Hyperlipidemia   . Hypertension   . Thyroid disease     Past Surgical History:  Procedure Laterality Date  . ABDOMINAL HYSTERECTOMY      Allergies  No Known Allergies  History of Present Illness    Adrienne Jackson is a 76yo female with PMH of HTN, HLD, hypothyroidism, heart murmur presenting to [facility] yesterday morning after an apparent syncopal episode at the breakfast table; workup was unremarkable, tele showed occasional bradycardia to 40s per EDP note and patient returned back to baseline, so she was discharged and asked to hold her beta blocker; istat trop at the time was negative.. She re-presented to ED yesterday afternoon after same occurred again at dinner table though second time she had some associated confusion and expressive aphasia noted by EDP.  Neurology has been consulted; patient had CT head which was largely unremarkable except for asymmetric left temporal atrophy; CTA head/neck was unremarkable; MRI brain with no acute finding except left temporal lobe atrophy noted to be similar pattern as semantic dementia. EEG was done this morning with results pending; she was placed on a trial of keppra on admission.  Orthostatics obtained yesterday morning and last night were negative.  Patient states that she has had no pre-syncopal symptoms yesterday associated with these events; family does report that she was  clammy and cold after first episode, and very hot after second episode. She states in the last few days, she has felt at her baseline; she denies recent fevers, chills, dizziness. She does endorse new right knee and right wrist swelling. She did endorse some mild dizziness on sitting up from lying down this morning which she relayed to her son. She denies chest pain, shortness of breath, falls, fatigue. She lives by herself and independently takes care of her house and 1 acre of land.   Her son states that her speech and word-finding has been getting progressively worse over the last few months; yesterday after the second episode her speech was worse than it usually is but is now significantly improved.   Inpatient Medications    .  stroke: mapping our early stages of recovery book   Does not apply Once  . aspirin EC  81 mg Oral Daily  . enoxaparin (LOVENOX) injection  40 mg Subcutaneous Q24H  . levETIRAcetam  500 mg Oral BID  . levothyroxine  75 mcg Oral QAC breakfast  . lisinopril  5 mg Oral Daily  . rosuvastatin  20 mg Oral Daily   Outpatient Medications    Prior to Admission medications   Medication Sig Start Date End Date Taking? Authorizing Provider  aspirin EC 81 MG tablet Take 81 mg by mouth daily.   Yes [provider]  bisoprolol-hydrochlorothiazide (ZIAC) 5-6.25 MG tablet Take 1 tablet by mouth daily.   Yes [provider]  levothyroxine (SYNTHROID, LEVOTHROID) 75 MCG tablet Take 75 mcg by mouth daily before breakfast.   Yes  [provider]  lisinopril (PRINIVIL,ZESTRIL) 5 MG tablet Take 5 mg by mouth daily. 08/14/17  Yes [provider]  rosuvastatin (CRESTOR) 20 MG tablet Take 20 mg by mouth daily.   Yes [provider]   Family History    Family History  Problem Relation Age of Onset  . Heart disease Father    Social History    Social History   Socioeconomic History  . Marital status: Single    Spouse name: Not on file  .  Number of children: Not on file  . Years of education: Not on file  . Highest education level: Not on file  Social Needs  . Financial resource strain: Not on file  . Food insecurity - worry: Not on file  . Food insecurity - inability: Not on file  . Transportation needs - medical: Not on file  . Transportation needs - non-medical: Not on file  Occupational History  . Not on file  Tobacco Use  . Smoking status: Never Smoker  . Smokeless tobacco: Never Used  Substance and Sexual Activity  . Alcohol use: No    Frequency: Never  . Drug use: No  . Sexual activity: Not on file  Other Topics Concern  . Not on file  Social History Narrative  . Not on file    Review of Systems    All other systems reviewed and are otherwise negative except as noted above.  Physical Exam    Blood pressure (!) 130/59, pulse 80, temperature 99.5 F (37.5 C), temperature source Oral, resp. rate 20, height 5\' 7"  (1.702 m), weight 182 lb 12.2 oz (82.9 kg), SpO2 95 %.  General: Pleasant, NAD Psych: Normal affect. Neuro: Alert and oriented X 3. Moves all extremities spontaneously. Aphasic speech HEENT: Normal  Neck: Supple without bruits or JVD. Lungs:  Resp regular and unlabored, CTA. Heart: RRR, grade 3/4 systolic crescendo-decrescendo murmur, no rubs or gallops Abdomen: Soft, non-tender, non-distended, +BS  Extremities: No edema. DP/PT/Radials 2+ and equal bilaterally. Right knee with increased warms and bony swelling; right wrist with decreased ROM due to pain with associated swelling and warmth.  Labs    Troponin [facility]Point of Care Test) Recent Labs    08/17/17 0841  TROPIPOC 0.01   No results for input(s): CKTOTAL, CKMB, TROPONINI in the last 72 hours. Lab Results  Component Value Date   WBC 9.3 08/18/2017   HGB 12.2 08/18/2017   HCT 36.8 08/18/2017   MCV 96.6 08/18/2017   PLT 134 (L) 08/18/2017    Recent Labs  Lab 08/18/17 0247  NA 136  K 3.4*  CL 104  CO2 26  BUN 8  CREATININE  0.73  CALCIUM 8.8*  GLUCOSE 114*   Lab Results  Component Value Date   CHOL 128 08/18/2017   HDL 69 08/18/2017   LDLCALC 49 08/18/2017   TRIG 52 08/18/2017   No results found for: [facility]   Radiology Studies    Ct Angio Head W Or Wo Contrast  Result Date: 08/17/2017 CLINICAL DATA:  Focal neuro deficit. Stroke suspected. Per previous history there is altered level of consciousness of multiple syncopal episodes today. EXAM: CT ANGIOGRAPHY HEAD AND NECK TECHNIQUE: Multidetector CT imaging of the head and neck was performed using the standard protocol during bolus administration of intravenous contrast. Multiplanar CT image reconstructions and MIPs were obtained to evaluate the vascular anatomy. Carotid stenosis measurements (when applicable) are obtained utilizing NASCET criteria, using the distal internal carotid diameter as the denominator.  CONTRAST:  50 cc Isovue 370 intravenous COMPARISON:  Head CT from earlier today FINDINGS: CTA NECK FINDINGS Aortic arch: Partially covered arch that is atheromatous. Brachiocephalic and complete left common carotid origins are not covered. Right carotid system: Minimal atheromatous changes. No stenosis or ulceration. Left carotid system: Mild atheromatous changes at the common carotid bifurcation and origin. No stenosis or ulceration. Vertebral arteries: No proximal subclavian stenosis. Mild right vertebral artery dominance. Streak artifact affects visualization of vessels at the level of the oral cavity. No flow limiting stenosis or dissection is suspected. Skeleton: Facet arthropathy and disc degeneration. No acute osseous finding. Other neck: No incidental mass or adenopathy. Upper chest: No acute finding Review of the MIP images confirms the above findings CTA HEAD FINDINGS Anterior circulation: Atherosclerotic plaque on the carotid siphons. No flow limiting stenosis or ulceration. Negative for branch occlusion. Negative for aneurysm. Posterior circulation:  Mild right vertebral artery dominance. No branch occlusion, flow limiting stenosis, or aneurysm. Mild atheromatous type narrowing of the mid right P2 segment Venous sinuses: Patent Anatomic variants: None significant Delayed phase: No abnormal intracranial enhancement. Review of the MIP images confirms the above findings IMPRESSION: 1. No acute finding. 2. Mild atherosclerosis without flow limiting stenosis in the head or neck. Electronically Signed   By: Marnee Spring M.D.   On: 08/17/2017 21:09   Dg Wrist Complete Right  Result Date: 08/17/2017 CLINICAL DATA:  Right wrist pain and swelling. EXAM: RIGHT WRIST - COMPLETE 3+ VIEW COMPARISON:  None. FINDINGS: No acute fracture or malalignment. Moderate first CMC and mild triscaphe degenerative changes. Chondrocalcinosis of the TFCC. Osteopenia. Mild soft tissue swelling about the wrist. IMPRESSION: 1. Mild soft tissue swelling about the wrist. No acute osseous abnormality. 2. Chondrocalcinosis of the TFCC, as can be seen with CPPD arthropathy. 3. Moderate first CMC joint degenerative changes. Electronically Signed   By: Obie Dredge M.D.   On: 08/17/2017 11:50   Ct Head Wo Contrast  Result Date: 08/17/2017 CLINICAL DATA:  Altered level of consciousness. Multiple syncopal episodes today. Slight headache. EXAM: CT HEAD WITHOUT CONTRAST TECHNIQUE: Contiguous axial images were obtained from the base of the skull through the vertex without intravenous contrast. COMPARISON:  None. FINDINGS: Brain: No evidence of acute infarction, hemorrhage, hydrocephalus, extra-axial collection or mass lesion/mass effect. Brain volume is overall normal for age but there is asymmetric left temporal atrophy and sulcal widening. This is of indeterminate significance in the absence of dementia/ memory loss history. Vascular: No hyperdense vessel or unexpected calcification. Skull: Normal. Negative for fracture or focal lesion. Sinuses/Orbits: Unremarkable IMPRESSION: No acute  finding. Electronically Signed   By: Marnee Spring M.D.   On: 08/17/2017 18:47   Ct Angio Neck W And/or Wo Contrast  Result Date: 08/17/2017 CLINICAL DATA:  Focal neuro deficit. Stroke suspected. Per previous history there is altered level of consciousness of multiple syncopal episodes today. EXAM: CT ANGIOGRAPHY HEAD AND NECK TECHNIQUE: Multidetector CT imaging of the head and neck was performed using the standard protocol during bolus administration of intravenous contrast. Multiplanar CT image reconstructions and MIPs were obtained to evaluate the vascular anatomy. Carotid stenosis measurements (when applicable) are obtained utilizing NASCET criteria, using the distal internal carotid diameter as the denominator. CONTRAST:  50 cc Isovue 370 intravenous COMPARISON:  Head CT from earlier today FINDINGS: CTA NECK FINDINGS Aortic arch: Partially covered arch that is atheromatous. Brachiocephalic and complete left common carotid origins are not covered. Right carotid system: Minimal atheromatous changes. No stenosis or ulceration. Left  carotid system: Mild atheromatous changes at the common carotid bifurcation and origin. No stenosis or ulceration. Vertebral arteries: No proximal subclavian stenosis. Mild right vertebral artery dominance. Streak artifact affects visualization of vessels at the level of the oral cavity. No flow limiting stenosis or dissection is suspected. Skeleton: Facet arthropathy and disc degeneration. No acute osseous finding. Other neck: No incidental mass or adenopathy. Upper chest: No acute finding Review of the MIP images confirms the above findings CTA HEAD FINDINGS Anterior circulation: Atherosclerotic plaque on the carotid siphons. No flow limiting stenosis or ulceration. Negative for branch occlusion. Negative for aneurysm. Posterior circulation: Mild right vertebral artery dominance. No branch occlusion, flow limiting stenosis, or aneurysm. Mild atheromatous type narrowing of the  mid right P2 segment Venous sinuses: Patent Anatomic variants: None significant Delayed phase: No abnormal intracranial enhancement. Review of the MIP images confirms the above findings IMPRESSION: 1. No acute finding. 2. Mild atherosclerosis without flow limiting stenosis in the head or neck. Electronically Signed   By: Marnee SpringJonathon  Watts M.D.   On: 08/17/2017 21:09   Mr Brain Wo Contrast  Result Date: 08/17/2017 CLINICAL DATA:  Unexplained altered level of consciousness. EXAM: MRI HEAD WITHOUT CONTRAST TECHNIQUE: Multiplanar, multiecho pulse sequences of the brain and surrounding structures were obtained without intravenous contrast. COMPARISON:  CT head and CTA head neck from earlier the same day. FINDINGS: Brain: No acute infarction, hemorrhage, hydrocephalus, extra-axial collection or mass lesion. There is notable asymmetric left temporal lobe atrophy with sulcal widening and hippocampus thinning. Elsewhere brain volume is unremarkable for age. Per notes, the patient does have a history of dementia. Normal white matter appearance. Vascular: Major flow voids are preserved. Skull and upper cervical spine: Cervical disc and facet degeneration. Sinuses/Orbits: Negative IMPRESSION: 1. No acute finding. 2. Asymmetric left temporal lobe atrophy, a pattern seen with semantic dementia. Electronically Signed   By: Marnee SpringJonathon  Watts M.D.   On: 08/17/2017 21:53    ECG & Cardiac Imaging    EKG, personally reviewed - regular rhythm, a lot of baseline artifact but appears to be sinus, no acute ST or T wave changes  Telemetry, personally reviewed - SR  Assessment & Plan    Syncope vs seizure: Atypical presentation; both episodes occurred during meal times; not classic seizure-like activity either. There is concern for stroke though so far workup has been negative. Telemetry reviewed and no further episodes of bradycardia and no other arrhythmias noted. Orthostatics have been negative. TSH wnl on current dose of  synthroid. Severe valve dysfunction and/or hypertrophic cardiomyopathy as a cause of her syncope is certainly in the differential in setting of her significant murmur. Since all episodes have occurred while eating vasovagal etiology or esophageal pathology could be a factor as well. --f/u echo --monitor on telemetry; if no arrhythmias or abnormalities noted, she would benefit from a 30d monitor at discharge --keppra per neuro  HTN: Agree with holding BB for now.  --lisinopril 5mg  daily  HLD: Continue crestor 20mg  daily.  Hypothyroidism: TSH wnl - continued on home synthroid.   Dementia: Progressive word finding difficulty per family with MRI/CT findings showing left temporal lobe atrophy which could be consistent with semantic dementia. She is otherwise apparently very independent. Neuro following.  Signed, Nyra MarketGorica Mancel Lardizabal, MD 08/18/2017, 11:22 AM

## 2017-08-18 NOTE — Evaluation (Signed)
Speech Language Pathology Evaluation Patient Details Name: Adrienne ConstableCatherine Jackson MRN: [id number] DOB: 09/26/1941 Today's Date: 08/18/2017 Time: 1300-1340 SLP Time Calculation (min) (ACUTE ONLY): 40 min  Problem List:  Patient Active Problem List   Diagnosis Date Noted  . Language impairment 08/18/2017  . Cognitive impairment 08/18/2017  . Pseudogout of right wrist 08/18/2017  . Undiagnosed cardiac murmurs 08/18/2017  . Abnormal brain MRI 08/18/2017  . Essential hypertension 08/18/2017  . Syncope and collapse 08/18/2017  . Pure hypercholesterolemia 08/18/2017  . Aphasia   . Confusion   . Moderate aortic stenosis   . Transient neurologic deficit 08/17/2017   Past Medical History:  Past Medical History:  Diagnosis Date  . Essential hypertension 08/18/2017  . Hyperlipidemia   . Hypertension   . Pure hypercholesterolemia 08/18/2017  . Syncope 08/18/2017  . Thyroid disease    Past Surgical History:  Past Surgical History:  Procedure Laterality Date  . ABDOMINAL HYSTERECTOMY     HPI:  Adrienne LewandowskyCatherine Jackson a 76 y.o.femalepresenting with repeat syncopal events and episodes of aphasia. CT head negative. MRI shows assymmetric left temporal lobe atrophy and notes this is a pattern seen with semantic dementia (also known as semantic variant primary progressive aphasia). PMH is significant forHTN, HLD, and hypothyroidism.     Assessment / Plan / Recommendation Clinical Impression  Pt demonstrates moderate expressive language deficits and mild to moderate receptive deficits. Speech is WNL; language characterized by fluent conversation with severe anomia, circumlocutions and empty speech - devoid of meaningful nouns and verbs. Pt comprehends basic language but struggles with increased complexity of prepositions and verb tense. Pt is able to demosntrate basic functional problem solving, organization and sequencing if pt is self directed and communication is not involved. Pts son reports  she has been managing her basic ADLs by following strict routines but has struggled to communicate for some time and has gotten progressively worse. Given MRI report of left temportal lobe atrophy would strongly recommend further outpatient work up with neurologist and speech pathologist for diagnosis of a probable language-based dementia like primary progressive aphasia. Pt will need supervision from family/caregivers to determine if pt is able to manage complex functional tasks and to provide assistance for language based tasks. Discussed with son. will follow acutely.     SLP Assessment  SLP Recommendation/Assessment: Patient needs continued Speech Lanaguage Pathology Services SLP Visit Diagnosis: Aphasia (R47.01)    Follow Up Recommendations  Outpatient SLP;24 hour supervision/assistance    Frequency and Duration min 2x/week  2 weeks      SLP Evaluation Cognition  Overall Cognitive Status: Impaired/Different from baseline Arousal/Alertness: Awake/alert Orientation Level: Other (comment)(aphasia) Attention: Alternating Alternating Attention: Appears intact Memory: Appears intact Awareness: Impaired Awareness Impairment: Emergent impairment       Comprehension  Auditory Comprehension Overall Auditory Comprehension: Impaired Yes/No Questions: Impaired Basic Biographical Questions: 76-100% accurate Basic Immediate Environment Questions: 75-100% accurate Complex Questions: 25-49% accurate Commands: Impaired One Step Basic Commands: 75-100% accurate Two Step Basic Commands: 0-24% accurate Multistep Basic Commands: 0-24% accurate Conversation: Simple Reading Comprehension Reading Status: (fluent reading, poor comprehension)    Expression Expression Primary Mode of Expression: Verbal Verbal Expression Overall Verbal Expression: Impaired Initiation: No impairment Level of Generative/Spontaneous Verbalization: Conversation Repetition: Impaired Level of Impairment: Sentence  level Naming: Impairment Responsive: Not tested Confrontation: Impaired Convergent: Not tested Divergent: Not tested Other Naming Comments: able to name 15 out of the first 24 images on [employer]. Ended procedure after 3 sequential errors.  Verbal Errors: (Circumlocution) Pragmatics: No  impairment Effective Techniques: (did not have success with cues) Written Expression Dominant Hand: Right   Oral / Motor  Oral Motor/Sensory Function Overall Oral Motor/Sensory Function: Within functional limits Motor Speech Overall Motor Speech: Appears within functional limits for tasks assessed   GO                   Harlon DittyBonnie Jerrol Helmers, MA CCC-SLP [phone]  Adrienne Jackson, Adrienne Jackson 08/18/2017, 2:27 PM

## 2017-08-18 NOTE — Progress Notes (Signed)
EEG Completed; Results Pending  

## 2017-08-19 ENCOUNTER — Other Ambulatory Visit: Payer: Self-pay | Admitting: Family Medicine

## 2017-08-19 LAB — BASIC METABOLIC PANEL
Anion gap: 6 (ref 5–15)
BUN: 11 mg/dL (ref 6–20)
CALCIUM: 8.5 mg/dL — AB (ref 8.9–10.3)
CHLORIDE: 104 mmol/L (ref 101–111)
CO2: 25 mmol/L (ref 22–32)
Creatinine, Ser: 0.77 mg/dL (ref 0.44–1.00)
Glucose, Bld: 99 mg/dL (ref 65–99)
Potassium: 4.1 mmol/L (ref 3.5–5.1)
SODIUM: 135 mmol/L (ref 135–145)

## 2017-08-19 LAB — GLUCOSE, CAPILLARY
GLUCOSE-CAPILLARY: 99 mg/dL (ref 65–99)
GLUCOSE-CAPILLARY: 93 mg/dL (ref 65–99)

## 2017-08-19 LAB — CBC
HCT: 37.4 % (ref 36.0–46.0)
Hemoglobin: 12.2 g/dL (ref 12.0–15.0)
MCH: 32.3 pg (ref 26.0–34.0)
MCHC: 32.6 g/dL (ref 30.0–36.0)
MCV: 98.9 fL (ref 78.0–100.0)
Platelets: 143 10*3/uL — ABNORMAL LOW (ref 150–400)
RBC: 3.78 MIL/uL — AB (ref 3.87–5.11)
RDW: 13.8 % (ref 11.5–15.5)
WBC: 6.8 10*3/uL (ref 4.0–10.5)

## 2017-08-19 LAB — URINE CULTURE

## 2017-08-19 MED ORDER — LEVETIRACETAM 500 MG PO TABS: 500.0000 mg | ORAL_TABLET | Freq: Two times a day (BID) | ORAL | 0 refills | Status: DC

## 2017-08-19 MED ORDER — HYDROCHLOROTHIAZIDE 12.5 MG PO CAPS: 12.5000 mg | ORAL_CAPSULE | Freq: Every day | ORAL | 0 refills | Status: DC

## 2017-08-19 NOTE — Progress Notes (Signed)
Family Medicine Teaching Service Daily Progress Note Intern Pager: [phone]  Patient name: Adrienne Jackson Medical record number: [id number] Date of birth: [birth year] Age: 76 y.o. Gender: female  Primary Care Provider: Patient, No Pcp Per Consultants: Neurology, Cardiology  Code Status: Full  Pt Overview and Major Events to Date:  11/22 - admitted for syncope vs TIA workup  Assessment and Plan: Adrienne Jackson is a 76 y.o. female presenting with repeat syncopal events. PMH is significant for HTN, HLD, and hypothyroidism.  Episodes of decreased consciousness and aphasia S/p 2 episodes of LOC lasting about 15 minutes with no prodromal symptoms. Also with word finding difficulty but seems to have been about a few months in duration. Concern for stroke vs cardiac syncope vs seizure. CT head, CTA neck, MRI brain negative for acute stroke. MRI brain showed left temporal lobe atrophy concerning for progressive dementia. EEG normal. Echo EF 60-65% with moderate AS but unlikely cause of syncope. Orthostatics wnl. Neurology consulted and started Keppra. TSH wnl.  A1C 5.8 and lipid panel wnl. Reversible dementia work up RPR, B12 wnl. Passed swallow eval but recommend outpatient follow up. Urine culture contaminated. PT/OT eval recommended HH.  - Cardiology recs appreciated - outpatient 30 day event monitor - Neurology recs appreciated - continue Keppra trial, outpt neuropsych eval - q4h neuro checks  HTN One hypertensive episode to SBP of 148 overnight, normotensive this am 11/24. Home medications include lisinopril 5 mg, bisoprolol-hctz 5-6.25 mg. Did have bradycardic episode to 40s in ED, holding BB. - continue lisinopril 5 mg - continue to hold beta blocker in setting of bradycardic episode  Right wrist pain S/p falling earlier today. Mild soft tissue swelling on xray in ED and chondrocalcinosis concerning for psuedogout. Improved with ice. - Ice prn - tylenol, ibuprofen  prn  FEN/GI: heart healthy Prophylaxis: lovenox  Disposition: pending neuro recs  Subjective:  Patient feels well today. Son and daughter have extensive questions regarding further workup, don't have clear picture of what is causing episodes.  Objective: Temp:  [98.4 F (36.9 C)-100.8 F (38.2 C)] 98.4 F (36.9 C) (11/24 0618) Pulse Rate:  [72-84] 72 (11/24 0618) Resp:  [20] 20 (11/24 0618) BP: (125-148)/(53-71) 125/63 (11/24 0618) SpO2:  [96 %-99 %] 97 % (11/24 0618)  Physical Exam: General: NAD, sitting in bed Cardiovascular: RRR, systolic murmur, no LE edema Respiratory: CTAB, normal work of breathing, no wheezes/crackles Abdomen: soft, nontender, nondistended Neuro: right wrist mildly swollen and warm, 3/5 grip strength on right hand limited due to swelling, 3/5 RUE strength. 5/5 strength throughout all other extremities. Sensation is intact.   Laboratory: Recent Labs  Lab 08/17/17 0825 08/17/17 1759 08/18/17 0247  WBC 9.4  --  9.3  HGB 12.8 11.9* 12.2  HCT 38.2 35.0* 36.8  PLT 138*  --  134*   Recent Labs  Lab 08/17/17 0825 08/17/17 1759 08/18/17 0247  NA 136 136 136  K 4.0 3.6 3.4*  CL 104 101 104  CO2 24  --  26  BUN 9 11 8   CREATININE 0.84 0.70 0.73  CALCIUM 8.8*  --  8.8*  GLUCOSE 170* 176* 114*   Ct Angio Head W Or Wo Contrast  Result Date: 08/17/2017 CLINICAL DATA:  Focal neuro deficit. Stroke suspected. Per previous history there is altered level of consciousness of multiple syncopal episodes today. EXAM: CT ANGIOGRAPHY HEAD AND NECK TECHNIQUE: Multidetector CT imaging of the head and neck was performed using the standard protocol during bolus administration of intravenous contrast. Multiplanar CT  image reconstructions and MIPs were obtained to evaluate the vascular anatomy. Carotid stenosis measurements (when applicable) are obtained utilizing NASCET criteria, using the distal internal carotid diameter as the denominator. CONTRAST:  50 cc Isovue  370 intravenous COMPARISON:  Head CT from earlier today FINDINGS: CTA NECK FINDINGS Aortic arch: Partially covered arch that is atheromatous. Brachiocephalic and complete left common carotid origins are not covered. Right carotid system: Minimal atheromatous changes. No stenosis or ulceration. Left carotid system: Mild atheromatous changes at the common carotid bifurcation and origin. No stenosis or ulceration. Vertebral arteries: No proximal subclavian stenosis. Mild right vertebral artery dominance. Streak artifact affects visualization of vessels at the level of the oral cavity. No flow limiting stenosis or dissection is suspected. Skeleton: Facet arthropathy and disc degeneration. No acute osseous finding. Other neck: No incidental mass or adenopathy. Upper chest: No acute finding Review of the MIP images confirms the above findings CTA HEAD FINDINGS Anterior circulation: Atherosclerotic plaque on the carotid siphons. No flow limiting stenosis or ulceration. Negative for branch occlusion. Negative for aneurysm. Posterior circulation: Mild right vertebral artery dominance. No branch occlusion, flow limiting stenosis, or aneurysm. Mild atheromatous type narrowing of the mid right P2 segment Venous sinuses: Patent Anatomic variants: None significant Delayed phase: No abnormal intracranial enhancement. Review of the MIP images confirms the above findings IMPRESSION: 1. No acute finding. 2. Mild atherosclerosis without flow limiting stenosis in the head or neck. Electronically Signed   By: Marnee SpringJonathon  Watts M.D.   On: 08/17/2017 21:09   Dg Wrist Complete Right  Result Date: 08/17/2017 CLINICAL DATA:  Right wrist pain and swelling. EXAM: RIGHT WRIST - COMPLETE 3+ VIEW COMPARISON:  None. FINDINGS: No acute fracture or malalignment. Moderate first CMC and mild triscaphe degenerative changes. Chondrocalcinosis of the TFCC. Osteopenia. Mild soft tissue swelling about the wrist. IMPRESSION: 1. Mild soft tissue swelling  about the wrist. No acute osseous abnormality. 2. Chondrocalcinosis of the TFCC, as can be seen with CPPD arthropathy. 3. Moderate first CMC joint degenerative changes. Electronically Signed   By: Obie DredgeWilliam T Derry M.D.   On: 08/17/2017 11:50   Ct Head Wo Contrast  Result Date: 08/17/2017 CLINICAL DATA:  Altered level of consciousness. Multiple syncopal episodes today. Slight headache. EXAM: CT HEAD WITHOUT CONTRAST TECHNIQUE: Contiguous axial images were obtained from the base of the skull through the vertex without intravenous contrast. COMPARISON:  None. FINDINGS: Brain: No evidence of acute infarction, hemorrhage, hydrocephalus, extra-axial collection or mass lesion/mass effect. Brain volume is overall normal for age but there is asymmetric left temporal atrophy and sulcal widening. This is of indeterminate significance in the absence of dementia/ memory loss history. Vascular: No hyperdense vessel or unexpected calcification. Skull: Normal. Negative for fracture or focal lesion. Sinuses/Orbits: Unremarkable IMPRESSION: No acute finding. Electronically Signed   By: Marnee SpringJonathon  Watts M.D.   On: 08/17/2017 18:47   Ct Angio Neck W And/or Wo Contrast  Result Date: 08/17/2017 CLINICAL DATA:  Focal neuro deficit. Stroke suspected. Per previous history there is altered level of consciousness of multiple syncopal episodes today. EXAM: CT ANGIOGRAPHY HEAD AND NECK TECHNIQUE: Multidetector CT imaging of the head and neck was performed using the standard protocol during bolus administration of intravenous contrast. Multiplanar CT image reconstructions and MIPs were obtained to evaluate the vascular anatomy. Carotid stenosis measurements (when applicable) are obtained utilizing NASCET criteria, using the distal internal carotid diameter as the denominator. CONTRAST:  50 cc Isovue 370 intravenous COMPARISON:  Head CT from earlier today FINDINGS: CTA NECK  FINDINGS Aortic arch: Partially covered arch that is  atheromatous. Brachiocephalic and complete left common carotid origins are not covered. Right carotid system: Minimal atheromatous changes. No stenosis or ulceration. Left carotid system: Mild atheromatous changes at the common carotid bifurcation and origin. No stenosis or ulceration. Vertebral arteries: No proximal subclavian stenosis. Mild right vertebral artery dominance. Streak artifact affects visualization of vessels at the level of the oral cavity. No flow limiting stenosis or dissection is suspected. Skeleton: Facet arthropathy and disc degeneration. No acute osseous finding. Other neck: No incidental mass or adenopathy. Upper chest: No acute finding Review of the MIP images confirms the above findings CTA HEAD FINDINGS Anterior circulation: Atherosclerotic plaque on the carotid siphons. No flow limiting stenosis or ulceration. Negative for branch occlusion. Negative for aneurysm. Posterior circulation: Mild right vertebral artery dominance. No branch occlusion, flow limiting stenosis, or aneurysm. Mild atheromatous type narrowing of the mid right P2 segment Venous sinuses: Patent Anatomic variants: None significant Delayed phase: No abnormal intracranial enhancement. Review of the MIP images confirms the above findings IMPRESSION: 1. No acute finding. 2. Mild atherosclerosis without flow limiting stenosis in the head or neck. Electronically Signed   By: Marnee SpringJonathon  Watts M.D.   On: 08/17/2017 21:09   Mr Brain Wo Contrast  Result Date: 08/17/2017 CLINICAL DATA:  Unexplained altered level of consciousness. EXAM: MRI HEAD WITHOUT CONTRAST TECHNIQUE: Multiplanar, multiecho pulse sequences of the brain and surrounding structures were obtained without intravenous contrast. COMPARISON:  CT head and CTA head neck from earlier the same day. FINDINGS: Brain: No acute infarction, hemorrhage, hydrocephalus, extra-axial collection or mass lesion. There is notable asymmetric left temporal lobe atrophy with sulcal  widening and hippocampus thinning. Elsewhere brain volume is unremarkable for age. Per notes, the patient does have a history of dementia. Normal white matter appearance. Vascular: Major flow voids are preserved. Skull and upper cervical spine: Cervical disc and facet degeneration. Sinuses/Orbits: Negative IMPRESSION: 1. No acute finding. 2. Asymmetric left temporal lobe atrophy, a pattern seen with semantic dementia. Electronically Signed   By: Marnee SpringJonathon  Watts M.D.   On: 08/17/2017 21:53   Ellwood DenseRumball, Alison, DO 08/19/2017, 7:16 AM PGY-1, [facility] FPTS Intern pager: [phone], text pages welcome

## 2017-08-19 NOTE — Progress Notes (Signed)
pt d/c home, pt is stable, with no new concerns, d/c instructions done with teach back, pt/family verbalize understanding. Pt is transported from hospital by family.

## 2017-08-19 NOTE — Care Management Note (Signed)
Case Management Note  Patient Details  Name: Venetia ConstableCatherine Amorin MRN: [id number] Date of Birth: 02/06/1941  Subjective/Objective:  Pt admitted with syncopal episodes                 Action/Plan:  PTA independent from home.  Pt will [place] to sisters home in [place] - eventually [place] to home in [place].  [facility] - agency can accept pt for both [place] and [place] when pt goes back to her home.  P[place] address in [place] will be [address] [place] - [facility]   Expected Discharge Date:  08/19/17               Expected Discharge Plan:  Home w Home Health Services  In-House Referral:     Discharge planning Services  CM Consult  Post Acute Care Choice:    Choice offered to:     DME Arranged:    DME Agency:     HH Arranged:  PT, OT, Speech Therapy HH Agency:  [facility]  Status of Service:  In process, will continue to follow  If discussed at Long Length of Stay Meetings, dates discussed:    Additional Comments:  Cherylann ParrClaxton, Abbas Beyene S, RN 08/19/2017, 2:18 PM

## 2017-08-19 NOTE — Discharge Instructions (Signed)
You were admitted for syncope and work up completed for seizure, stroke, and cardiac causes. Neurology was consulted who started an anti-seizure medication, Keppra, and recommended further outpatient evaluation for dementia. Cardiology was consulted who recommended a 30 day event monitor outpatient to assess for any arrhythmias causing syncope episodes.  You should make an appointment to follow up with Dr. Danella DeisKelling as soon as available. You will be getting a phone call from the Cardiologist's office to set up an appointment to receive an event monitor. Your primary doctor can arrange follow up with your previous neurologist to obtain further dementia work up.

## 2017-08-21 ENCOUNTER — Telehealth: Payer: Self-pay | Admitting: *Deleted

## 2017-08-21 NOTE — Telephone Encounter (Signed)
[name] wanted to let Dr. McDiarmid know that the daughter wanted her to wait to come out to assess for speech therapy until Friday.  She would like to be present. If this is not an acceptable time frame please call [name] back @ [phone]. Ashtan Laton, Maryjo RochesterJessica Dawn, CMA

## 2017-08-24 ENCOUNTER — Ambulatory Visit (INDEPENDENT_AMBULATORY_CARE_PROVIDER_SITE_OTHER): Payer: Medicare Other

## 2017-08-24 DIAGNOSIS — R55 Syncope and collapse: Secondary | ICD-10-CM

## 2017-09-18 DIAGNOSIS — E785 Hyperlipidemia, unspecified: Secondary | ICD-10-CM | POA: Insufficient documentation

## 2017-09-18 DIAGNOSIS — I10 Essential (primary) hypertension: Secondary | ICD-10-CM | POA: Insufficient documentation

## 2017-09-18 DIAGNOSIS — E079 Disorder of thyroid, unspecified: Secondary | ICD-10-CM | POA: Insufficient documentation

## 2017-09-18 DIAGNOSIS — E039 Hypothyroidism, unspecified: Secondary | ICD-10-CM | POA: Insufficient documentation

## 2017-09-20 NOTE — Progress Notes (Signed)
Cardiology Office Note   Date:  09/22/2017   ID:  Adrienne Jackson, DOB 04-May-1941, MRN [id number]  PCP:  Patient, No Pcp Per  Cardiologist:   Chilton Si, MD   No chief complaint on file.     History of Present Illness: Adrienne Jackson is a 76 y.o. female with hypertension, hyperlipidemia, and moderate aortic stenosis  who is being seen today for the evaluation of near syncope.  Adrienne Jackson was admitted 09/2016 for episodes of loss of consciousness.  The episodes occurred while seated at the table.  They lasted approximately 15 minutes and were associated with word finding difficulty.  There was concern for stroke versus cardiogenic syncope versus seizure.  She had a head CT, CT-A of the neck, and MRI/MRA of the brain that were negative for acute stroke. Her heart rate was in the 40s on her first admission.  Bisoprolol was discontinued and she had a recurrent event the following day, at which time she was not bradycardic. Echo that admission revealed LVEF 60-65% with normal diastolic function and mild LVH.  She also was noted to have moderate aortic stenosis (mean gradient 29 mmHg).  She followed up with Dr. Ellwood Dense and was referred to cardiology for further evaluation.  She is currently wearing a 30 day event monitor scheduled to end today.  Thus far it is demonstrated no arrhythmias.  She has been feeling well.  Initially upon discharge from the hospital she was working in physical therapy due to weakness.  She is now feeling much stronger.  She is able to ambulate and go up and down stairs without assistance.  She has no chest pain or shortness of breath.  She has not noted any lightheadedness or dizziness.  She denies lower extremity edema, orthopnea, or PND.  She denies any recurrent syncope.  She wonders when she will be able to start back driving.  Adrienne Jackson brings a log of her blood pressures and heart rates.  They have been mostly running in the 130s-160s  systolic.  Her heart rates have been in the 80s-90s.   Past Medical History:  Diagnosis Date  . Essential hypertension 08/18/2017  . Hyperlipidemia   . Hypertension   . Pure hypercholesterolemia 08/18/2017  . Syncope 08/18/2017  . Thyroid disease     Past Surgical History:  Procedure Laterality Date  . ABDOMINAL HYSTERECTOMY       Current Outpatient Medications  Medication Sig Dispense Refill  . aspirin EC 81 MG tablet Take 81 mg by mouth daily.    . hydrochlorothiazide (MICROZIDE) 12.5 MG capsule Take 12.5 mg by mouth daily.    Marland Kitchen levETIRAcetam (KEPPRA) 500 MG tablet Take 500 mg by mouth 2 (two) times daily.    Marland Kitchen levothyroxine (SYNTHROID, LEVOTHROID) 75 MCG tablet Take 75 mcg by mouth daily before breakfast.    . rosuvastatin (CRESTOR) 20 MG tablet Take 20 mg by mouth daily.    Marland Kitchen lisinopril (PRINIVIL,ZESTRIL) 10 MG tablet Take 1 tablet (10 mg total) by mouth daily. 30 tablet 4   No current facility-administered medications for this visit.     Allergies:   Patient has no known allergies.    Social History:  The patient  reports that  has never smoked. she has never used smokeless tobacco. She reports that she does not drink alcohol or use drugs.   Family History:  The patient's family history includes Heart disease in her father.    ROS:  Please see the history of  present illness.   Otherwise, review of systems are positive for none.   All other systems are reviewed and negative.    PHYSICAL EXAM: VS:  BP (!) 148/82   Pulse 90   Ht 5\' 7"  (1.702 m)   Wt 178 lb 6.4 oz (80.9 kg)   BMI 27.94 kg/m  , BMI Body mass index is 27.94 kg/m. GENERAL:  Well appearing.  No acute distress.  HEENT:  Pupils equal round and reactive, fundi not visualized, oral mucosa unremarkable NECK:  No jugular venous distention, waveform within normal limits, carotid upstroke brisk and symmetric, no bruits, no thyromegaly LYMPHATICS:  No cervical adenopathy LUNGS:  Clear to auscultation  bilaterally HEART:  RRR.  PMI not displaced or sustained,S1 and S2 within normal limits, no S3, no S4, no clicks, no rubs, III/VI early-peaking systolic murmur at the LUSB.  ABD:  Flat, positive bowel sounds normal in frequency in pitch, no bruits, no rebound, no guarding, no midline pulsatile mass, no hepatomegaly, no splenomegaly EXT:  2 plus pulses throughout, no edema, no cyanosis no clubbing SKIN:  No rashes no nodules NEURO:  Cranial nerves II through XII grossly intact, motor grossly intact throughout.  Expressive aphasia. PSYCH:  Cognitively intact, oriented to person place and time    EKG:  EKG is ordered today. The ekg ordered today demonstrates sinus rhythm.  Rate 90 bpm.    Echo 08/18/17: Study Conclusions  - Left ventricle: The cavity size was normal. Wall thickness was   increased in a pattern of mild LVH. Systolic function was normal.   The estimated ejection fraction was in the range of 60% to 65%.   Left ventricular diastolic function parameters were normal. - Aortic valve: There was moderate stenosis. Valve area (VTI): 1.52   cm^2. Valve area (Vmax): 1.63 cm^2. Valve area (Vmean): 1.37   cm^2. - Left atrium: The atrium was mildly dilated. - Atrial septum: A patent foramen ovale cannot be excluded.Study Conclusions    Recent Labs: 08/18/2017: TSH 2.758 08/19/2017: BUN 11; Creatinine, Ser 0.77; Hemoglobin 12.2; Platelets 143; Potassium 4.1; Sodium 135    Lipid Panel    Component Value Date/Time   CHOL 128 08/18/2017 0247   TRIG 52 08/18/2017 0247   HDL 69 08/18/2017 0247   CHOLHDL 1.9 08/18/2017 0247   VLDL 10 08/18/2017 0247   LDLCALC 49 08/18/2017 0247      Wt Readings from Last 3 Encounters:  09/22/17 178 lb 6.4 oz (80.9 kg)  08/17/17 182 lb 12.2 oz (82.9 kg)      ASSESSMENT AND PLAN:  # Syncope vs. Seizure: Suspect this is bradycardia related.   She has no recurrent episodes.  This is not consistent with neurocardiogenic or orthostatic  syncope.  It is possible that it is neurologic.  Given that she was not wearing a monitor at the time of the event, it may be impossible to ever tell what truly happened that day.  According to him with colonic driving guidelines she is to wait 6 months after an episode of unexplained syncope.  We will await the results of her Holter monitor.  If unremarkable, I think it would be reasonable for her to be able to be unsupervised.  As of now, she is living with family members who do not let her out of  there is site while alone.  If we consider that this was bradycardia mediated, it would be reasonable to allow her to drive if she has no recurrent events after 3  months.  We will also await the input of neurology, who she plans to see next month.   # Hypertension: Blood pressure is poorly-controlled since stopping bisoprolol.  Continue hydrochlorothiazide and increase lisinopril to 10 mg daily.  She will have a basic metabolic panel checked with her primary care provider next week and will fax these results to us.  # Moderate aortic stenosis: Mean gradient 29 mmHg.  She is asymptomatic.  Repeat echo 07/2018.  # Hyperlipidemia: LDL 49 07/2017.  Continue rosuvastatin.   Current medicines are reviewed at length with the patient today.  The patient does not have concerns regarding medicines.  The following changes have been made:  Increase lisinopril  Labs/ tests ordered today include:  No orders of the defined types were placed in this encounter.    Disposition:   FU with Lin Hackmann C. Duke Salviaandolph, MD, [facility] in 1 month.     This note was written with the assistance of speech recognition software.  Please excuse any transcriptional errors.  Signed, Gilbert Manolis C. Duke Salviaandolph, MD, [facility]  09/22/2017 1:48 PM    [facility]

## 2017-09-22 ENCOUNTER — Ambulatory Visit (INDEPENDENT_AMBULATORY_CARE_PROVIDER_SITE_OTHER): Payer: Medicare Other | Admitting: Cardiovascular Disease

## 2017-09-22 ENCOUNTER — Encounter: Payer: Self-pay | Admitting: Cardiovascular Disease

## 2017-09-22 VITALS — BP 148/82 | HR 90 | Ht 67.0 in | Wt 178.4 lb

## 2017-09-22 DIAGNOSIS — I350 Nonrheumatic aortic (valve) stenosis: Secondary | ICD-10-CM | POA: Diagnosis not present

## 2017-09-22 DIAGNOSIS — I495 Sick sinus syndrome: Secondary | ICD-10-CM

## 2017-09-22 DIAGNOSIS — I10 Essential (primary) hypertension: Secondary | ICD-10-CM | POA: Diagnosis not present

## 2017-09-22 DIAGNOSIS — E7800 Pure hypercholesterolemia, unspecified: Secondary | ICD-10-CM | POA: Diagnosis not present

## 2017-09-22 DIAGNOSIS — R55 Syncope and collapse: Secondary | ICD-10-CM | POA: Diagnosis not present

## 2017-09-22 MED ORDER — LISINOPRIL 10 MG PO TABS: 10.0000 mg | ORAL_TABLET | Freq: Every day | ORAL | 4 refills | Status: DC

## 2017-09-22 NOTE — Patient Instructions (Signed)
Medication changes  Increase lisinopril 10 mg one tablet daily.  AND NO OTHER CHANGES WITH MEDICATIONS    Labs Please have labs (BMP) at primary office faxed to our office -- [phone]     Your physician recommends that you schedule a follow-up appointment in 1 MONTH WITH DR Atkinson.   If you need a refill on your cardiac medications before your next appointment, please call your pharmacy.

## 2017-09-29 NOTE — Addendum Note (Signed)
Addended by: Barrie DunkerHOMAS, Adin Lariccia N on: 09/29/2017 05:22 PM   Modules accepted: Orders

## 2017-10-07 ENCOUNTER — Encounter: Payer: Self-pay | Admitting: Cardiovascular Disease

## 2017-10-23 ENCOUNTER — Ambulatory Visit: Payer: Medicare Other | Admitting: Cardiovascular Disease

## 2017-10-23 ENCOUNTER — Encounter: Payer: Self-pay | Admitting: Cardiovascular Disease

## 2017-10-23 VITALS — BP 134/68 | HR 88 | Ht 67.0 in | Wt 181.6 lb

## 2017-10-23 DIAGNOSIS — I10 Essential (primary) hypertension: Secondary | ICD-10-CM

## 2017-10-23 DIAGNOSIS — I350 Nonrheumatic aortic (valve) stenosis: Secondary | ICD-10-CM | POA: Diagnosis not present

## 2017-10-23 DIAGNOSIS — Z5181 Encounter for therapeutic drug level monitoring: Secondary | ICD-10-CM

## 2017-10-23 DIAGNOSIS — R55 Syncope and collapse: Secondary | ICD-10-CM | POA: Diagnosis not present

## 2017-10-23 DIAGNOSIS — E7800 Pure hypercholesterolemia, unspecified: Secondary | ICD-10-CM

## 2017-10-23 MED ORDER — LISINOPRIL 20 MG PO TABS: 20.0000 mg | ORAL_TABLET | Freq: Every day | ORAL | 5 refills | Status: DC

## 2017-10-23 NOTE — Patient Instructions (Signed)
Medication Instructions:  INCREASE YOUR LISINOPRIL TO 20 MG DAILY   Labwork: BMET IN 1 WEEK  Testing/Procedures: NONE  Follow-Up: Your physician recommends that you schedule a follow-up appointment in: PHARM D 1 MONTH FOR BLOOD PRESSURE  Your physician recommends that you schedule a follow-up appointment in: 4 MONTH OV [facility]   If you need a refill on your cardiac medications before your next appointment, please call your pharmacy.

## 2017-10-23 NOTE — Progress Notes (Signed)
Cardiology Office Note   Date:  10/23/2017   ID:  Adrienne Jackson, DOB 09/30/1940, MRN [id number]  PCP:  Acquanetta BellingKelling, James, MD  Cardiologist:   Chilton Siiffany Benham, MD  Neurologist: Loreli DollarIlona Humes, MD ringing she said that it was on the news recently and caused serious problems with problem  Chief Complaint  Patient presents with  . Follow-up      History of Present Illness: Adrienne Jackson is a 77 y.o. female with hypertension, hyperlipidemia, and moderate aortic stenosis here for follow-up.  She was initially seen 07/2017 for the evaluation of near syncope.  Adrienne Jackson was admitted 07/2017 for episodes of loss of consciousness.  The episodes occurred while seated at the table.  They lasted approximately 15 minutes and were associated with word finding difficulty.  There was concern for stroke versus cardiogenic syncope versus seizure.  She had a head CT, CT-A of the neck, and MRI/MRA of the brain that were negative for acute stroke. Her heart rate was in the 40s on her first admission.  Bisoprolol was discontinued and she had a recurrent event the following day, at which time she was not bradycardic. Echo that admission revealed LVEF 60-65% with normal diastolic function and mild LVH.  She also was noted to have moderate aortic stenosis (mean gradient 29 mmHg).  She followed up with Dr. Ellwood DenseAlison Rumball and was referred to cardiology for further evaluation.  She wore a 30-day event monitor that showed occasional PACs and was otherwise unremarkable.  Since her last appointment Adrienne Jackson has been doing well.  She brings a log of her blood pressure showing that they have been ranging from 119-164 over 70s-90s.  On average it is in the 130s over 80s.  She has been feeling well physically and walks for exercise 30-45 minutes daily.  She has no exertional chest pain or shortness of breath.  She also denies lower extremity edema, orthopnea, or PND.  She has no lightheadedness or dizziness and  has not had any recurrent syncope or presyncope.  She has been following up with a neurologist in [place], Dr. Loreli DollarIlona Humes, and is scheduled for an epilepsy study later this month.  She is also undergoing additional cognitive testing for dementia.    Past Medical History:  Diagnosis Date  . Essential hypertension 08/18/2017  . Hyperlipidemia   . Hypertension   . Pure hypercholesterolemia 08/18/2017  . Syncope 08/18/2017  . Thyroid disease     Past Surgical History:  Procedure Laterality Date  . ABDOMINAL HYSTERECTOMY       Current Outpatient Medications  Medication Sig Dispense Refill  . aspirin EC 81 MG tablet Take 81 mg by mouth daily.    . hydrochlorothiazide (MICROZIDE) 12.5 MG capsule Take 12.5 mg by mouth daily.    Marland Kitchen. levETIRAcetam (KEPPRA) 500 MG tablet Take 750 mg by mouth 2 (two) times daily.     Marland Kitchen. levothyroxine (SYNTHROID, LEVOTHROID) 75 MCG tablet Take 75 mcg by mouth daily before breakfast.    . lisinopril (PRINIVIL,ZESTRIL) 20 MG tablet Take 1 tablet (20 mg total) by mouth daily. 30 tablet 5  . rosuvastatin (CRESTOR) 20 MG tablet Take 20 mg by mouth daily.     No current facility-administered medications for this visit.     Allergies:   Patient has no known allergies.    Social History:  The patient  reports that  has never smoked. she has never used smokeless tobacco. She reports that she does not drink alcohol or use  drugs.   Family History:  The patient's family history includes Heart disease in her father.    ROS:  Please see the history of present illness.   Otherwise, review of systems are positive for none.   All other systems are reviewed and negative.    PHYSICAL EXAM: VS:  BP 134/68   Pulse 88   Ht 5\' 7"  (1.702 m)   Wt 181 lb 9.6 oz (82.4 kg)   SpO2 97%   BMI 28.44 kg/m  , BMI Body mass index is 28.44 kg/m. GENERAL:  Well appearing.  No acute distress HEENT: Pupils equal round and reactive, fundi not visualized, oral mucosa  unremarkable NECK:  No jugular venous distention, waveform within normal limits, carotid upstroke brisk and symmetric, no bruits, no thyromegaly LUNGS:  Clear to auscultation bilaterally HEART:  RRR.  PMI not displaced or sustained,S1 and S2 within normal limits, no S3, no S4, no clicks, no rubs, III/VI mid-systolic murmur at the LUSB ABD:  Flat, positive bowel sounds normal in frequency in pitch, no bruits, no rebound, no guarding, no midline pulsatile mass, no hepatomegaly, no splenomegaly EXT:  2 plus pulses throughout, no edema, no cyanosis no clubbing SKIN:  No rashes no nodules NEURO:  Cranial nerves II through XII grossly intact, motor grossly intact throughout.   PSYCH:  Cognitively intact, oriented to person place and time. Confused at times.   EKG:  EKG is not ordered today. The ekg ordered 09/22/17 demonstrates sinus rhythm.  Rate 90 bpm.    Echo 08/18/17: Study Conclusions  - Left ventricle: The cavity size was normal. Wall thickness was   increased in a pattern of mild LVH. Systolic function was normal.   The estimated ejection fraction was in the range of 60% to 65%.   Left ventricular diastolic function parameters were normal. - Aortic valve: There was moderate stenosis. Valve area (VTI): 1.52   cm^2. Valve area (Vmax): 1.63 cm^2. Valve area (Vmean): 1.37   cm^2. - Left atrium: The atrium was mildly dilated. - Atrial septum: A patent foramen ovale cannot be excluded.Study Conclusions  30-day event Monitor 08/24/17:  Quality: Fair.  Baseline artifact. Predominant rhythm: Sinus rhythm Average heart rate: 82 bpm Max heart rate: 124 bpm Min heart rate: 52 bpm  Occasional PACs noted  Recent Labs: 08/18/2017: TSH 2.758 08/19/2017: BUN 11; Creatinine, Ser 0.77; Hemoglobin 12.2; Platelets 143; Potassium 4.1; Sodium 135    Lipid Panel    Component Value Date/Time   CHOL 128 08/18/2017 0247   TRIG 52 08/18/2017 0247   HDL 69 08/18/2017 0247   CHOLHDL 1.9  08/18/2017 0247   VLDL 10 08/18/2017 0247   LDLCALC 49 08/18/2017 0247      Wt Readings from Last 3 Encounters:  10/23/17 181 lb 9.6 oz (82.4 kg)  09/22/17 178 lb 6.4 oz (80.9 kg)  08/17/17 182 lb 12.2 oz (82.9 kg)      ASSESSMENT AND PLAN:  # Syncope vs. Seizure: I suspect that her syncope was bradycardia related.  She has no recurrent episodes.  Her 30 day event monitor was unremarkable.  Her heart rate was much better after stopping her beta blocker.  The episode was not consistent with neurocardiogenic or orthostatic syncope.  She continues to undergo neurologic testing. Given that her event was likely occurred due to bradycardia and that this has resolved after stopping bisoprolol, she is okay to drive from a cardiac standpoint.  I am more concerned about her dementia than syncope with regard  to her ability to safely drive.  She seemed very easily confused and required a lot of prompting by her daughter throughout the interview.  # Hypertension: Blood pressure remains elevated.  Continue HCTZ and increase lisinopril to 20mg  daily.  Check BMP in one week.  Goal <130/80.  # Moderate aortic stenosis: Mean gradient 29 mmHg.  She is asymptomatic.  Repeat echo 07/2018.  # Hyperlipidemia: LDL 49 07/2017.  Continue rosuvastatin.   Current medicines are reviewed at length with the patient today.  The patient does not have concerns regarding medicines.  The following changes have been made:  Increase lisinopril to 20mg  daily.   Labs/ tests ordered today include:   Orders Placed This Encounter  Procedures  . Basic metabolic panel     Disposition:   FU with Kellan Raffield C. Duke Salvia, MD, [facility] in 4 months.  APP or PharmD in one month.     This note was written with the assistance of speech recognition software.  Please excuse any transcriptional errors.  Signed, Mohini Heathcock C. Duke Salvia, MD, [facility]  10/23/2017 2:43 PM    [facility]

## 2017-10-31 LAB — BASIC METABOLIC PANEL
BUN / CREAT RATIO: 17 (ref 12–28)
BUN: 13 mg/dL (ref 8–27)
CO2: 21 mmol/L (ref 20–29)
CREATININE: 0.76 mg/dL (ref 0.57–1.00)
Calcium: 9.3 mg/dL (ref 8.7–10.3)
Chloride: 103 mmol/L (ref 96–106)
GFR calc Af Amer: 87 mL/min/{1.73_m2} (ref >59)
GFR, EST NON AFRICAN AMERICAN: 75 mL/min/{1.73_m2} (ref >59)
GLUCOSE: 87 mg/dL (ref 65–99)
POTASSIUM: 4.2 mmol/L (ref 3.5–5.2)
SODIUM: 140 mmol/L (ref 134–144)

## 2017-11-05 ENCOUNTER — Encounter: Payer: Self-pay | Admitting: Cardiovascular Disease

## 2017-11-06 ENCOUNTER — Other Ambulatory Visit: Payer: Self-pay

## 2017-11-06 MED ORDER — HYDROCHLOROTHIAZIDE 12.5 MG PO CAPS: 12.5000 mg | ORAL_CAPSULE | Freq: Every day | ORAL | 1 refills | Status: DC

## 2017-11-06 MED ORDER — HYDROCHLOROTHIAZIDE 12.5 MG PO CAPS: 12.5000 mg | ORAL_CAPSULE | Freq: Every day | ORAL | 3 refills | Status: DC

## 2017-11-06 MED ORDER — LISINOPRIL 20 MG PO TABS: 20.0000 mg | ORAL_TABLET | Freq: Every day | ORAL | 2 refills | Status: DC

## 2017-11-06 MED ORDER — LISINOPRIL 20 MG PO TABS: 20.0000 mg | ORAL_TABLET | Freq: Every day | ORAL | 4 refills | Status: DC

## 2017-11-06 NOTE — Telephone Encounter (Signed)
-----   Message from Mychart, Generic sent at 11/05/2017 1:41 PM EST ----- Hi there- we don't have enough lisinopril to get through the month. Adrienne Jackson needs 20 mg order for lisinopril. Can we do a few refills through [facility] [address] in [place] until we have our BP follow up on March 7. She is using her 5mg  rx and is going to run out in the next week because she is using 4 per day. Once we have the BP check we will need enough to get her through until a mail order can arrive. She also needs her HCTZ 12.[facility] mg [facility] as soon as possible. Please advise

## 2017-11-30 ENCOUNTER — Ambulatory Visit: Payer: Medicare Other | Admitting: Pharmacist

## 2017-11-30 ENCOUNTER — Encounter: Payer: Self-pay | Admitting: Pharmacist

## 2017-11-30 VITALS — BP 150/82 | HR 78

## 2017-11-30 DIAGNOSIS — I10 Essential (primary) hypertension: Secondary | ICD-10-CM | POA: Diagnosis not present

## 2017-11-30 MED ORDER — CHLORTHALIDONE 25 MG PO TABS: 12.5000 mg | ORAL_TABLET | Freq: Every day | ORAL | 3 refills | Status: DC

## 2017-11-30 NOTE — Progress Notes (Signed)
Patient ID: Adrienne ConstableCatherine Jackson                 DOB: 06/28/1941                      MRN: [id number]     HPI: Adrienne ConstableCatherine Pearlman is a 77 y.o. female referred by Dr. Duke Salviaandolph to HTN clinic. PMH is significant for essential hypertension, hyperlipidemia, syncope, and thyroid disorder. Patient lives with her sister and sister's husband. Patient's sister and daughter help manage medications and care. She monitors her blood pressure at home and records readings on a BP log. Previous home readings have ranged from 119-164 mmHg systolic over 70-90 mmHg diastolic. Lisinopril was increased at her last office visit from 10 mg to 20 mg daily. Patient presents today for blood pressure management. Patient denies dizziness, increased fatigue, palpitations, or swelling.  Current HTN meds:  Hydrochlorothiazide 12.5 mg daily  Lisinopril 20 mg daily   Previously tried:  Bisoprolol-Hydrochlorothiazide 5-6.25mg  daily - d/c due to syncopal episodes thought to be caused by bradycardia. Lisinopril 5 mg and 10 mg  BP goal:  130/80 mmHg as tolerated.  Family History:  Father has a history of heart disease.  Social History:  Patient reports she does not smoke, use smokeless tobacco, alcohol, or drugs.  Diet: Patient tries to eat home cooked meals, but does eat take out a few times a week.  She does not add extra salt to her cooked foods. However, she does eat several foods that have a high sodium content.  Exercise: Patient walks outside around house/neighborhood daily as weather permits.   Home BP readings:   11/17/2017-11/29/2017  AM readings average: 142/89 mmHg PM readings average: 144/85 mmHg Daily Average: 138/87 mmHg   Wt Readings from Last 3 Encounters:  10/23/17 181 lb 9.6 oz (82.4 kg)  09/22/17 178 lb 6.4 oz (80.9 kg)  08/17/17 182 lb 12.2 oz (82.9 kg)   BP Readings from Last 3 Encounters:  11/30/17 (!) 150/82  10/23/17 134/68  09/22/17 (!) 148/82   Pulse Readings from Last 3 Encounters:   11/30/17 78  10/23/17 88  09/22/17 90    Past Medical History:  Diagnosis Date  . Essential hypertension 08/18/2017  . Hyperlipidemia   . Hypertension   . Pure hypercholesterolemia 08/18/2017  . Syncope 08/18/2017  . Thyroid disease     Current Outpatient Medications on File Prior to Visit  Medication Sig Dispense Refill  . aspirin EC 81 MG tablet Take 81 mg by mouth daily.    . cholecalciferol (VITAMIN D) 1000 units tablet Take 1,000 Units by mouth daily.    . divalproex (DEPAKOTE) 250 MG DR tablet Take 250 mg by mouth at bedtime.    . hydrochlorothiazide (MICROZIDE) 12.5 MG capsule Take 1 capsule (12.5 mg total) by mouth daily. 90 capsule 3  . levothyroxine (SYNTHROID, LEVOTHROID) 75 MCG tablet Take 75 mcg by mouth daily before breakfast.    . lisinopril (PRINIVIL,ZESTRIL) 20 MG tablet Take 1 tablet (20 mg total) by mouth daily. 30 tablet 4  . rosuvastatin (CRESTOR) 20 MG tablet Take 20 mg by mouth daily.     No current facility-administered medications on file prior to visit.     No Known Allergies  Blood pressure (!) 150/82, pulse 78, SpO2 98 %.  Essential hypertension Patient's BP readings at home and in clinic today are above goal despite the recent dose increase in lisinopril. Patient's blood pressure goal is 130/80 mmHg as tolerated due  to previous syncopal episodes. She denies any syncopal episodes or palpitations since being off of the bisoprolol-hydrochlorothiazide.    Patient was educated on limiting sodium intake and dietary changes. An educational document on the DASH diet was provided. Treatment options discussed with the patient and daughter today were increasing the lisinopril dose or switching to chlorthalidone 12.5 mg. Will switch hydrochlorothiazide 12.5 mg to chlorthalidone 12.5 mg. Patient is scheduled for labs in 2 weeks to assess kidney function upon changing medications. A follow up visit was scheduled for 4 weeks with the hypertension clinic.   Note  written by: Sherene Sires, PharmD Candidate  Assessment and plan discussed and approved by Leman Martinek Rodriguez-Guzman PharmD, BCPS, CPP [facility] [address] [place] [id number] 11/30/2017 11:30 AM

## 2017-11-30 NOTE — Assessment & Plan Note (Addendum)
Patient's BP readings at home and in clinic today are above goal despite the recent dose increase in lisinopril. Patient's blood pressure goal is 130/80 mmHg as tolerated due to previous syncopal episodes. She denies any syncopal episodes or palpitations since being off of the bisoprolol-hydrochlorothiazide.    Patient was educated on limiting sodium intake and dietary changes. An educational document on the DASH diet was provided. Treatment options discussed with the patient and daughter today were increasing the lisinopril dose or switching to chlorthalidone 12.5 mg. Will switch hydrochlorothiazide 12.5 mg to chlorthalidone 12.5 mg. Patient is scheduled for labs in 2 weeks to assess kidney function upon changing medications. A follow up visit was scheduled for 4 weeks with the hypertension clinic.

## 2017-11-30 NOTE — Patient Instructions (Addendum)
Return for a follow up appointment in 4 weeks  Check your blood pressure at home daily (if able) and keep record of the readings.  Take your BP meds as follows: *STOP taking HCTZ* *START taking chlorthalidone 12.5mg  daily every morning* *Repeat blood work in 2 weeks  Bring all your BP cuff and your record of home blood pressures to your next appointment.  Exercise as you're able, try to walk approximately 30 minutes per day.  Keep salt intake to a minimum, especially watch canned and prepared boxed foods.  Eat more fresh fruits and vegetables and fewer canned items.  Avoid eating in fast food restaurants.    HOW TO TAKE YOUR BLOOD PRESSURE: . Rest 5 minutes before taking your blood pressure. .  Don't smoke or drink caffeinated beverages for at least 30 minutes before. . Take your blood pressure before (not after) you eat. . Sit comfortably with your back supported and both feet on the floor (don't cross your legs). . Elevate your arm to heart level on a table or a desk. . Use the proper sized cuff. It should fit smoothly and snugly around your bare upper arm. There should be enough room to slip a fingertip under the cuff. The bottom edge of the cuff should be 1 inch above the crease of the elbow. . Ideally, take 3 measurements at one sitting and record the average.    DASH Eating Plan DASH stands for "Dietary Approaches to Stop Hypertension." The DASH eating plan is a healthy eating plan that has been shown to reduce high blood pressure (hypertension). It may also reduce your risk for type 2 diabetes, heart disease, and stroke. The DASH eating plan may also help with weight loss. What are tips for following this plan? General guidelines  Avoid eating more than 2,300 mg (milligrams) of salt (sodium) a day. If you have hypertension, you may need to reduce your sodium intake to 1,500 mg a day.  Limit alcohol intake to no more than 1 drink a day for nonpregnant women and 2 drinks a day for  men. One drink equals 12 oz of beer, 5 oz of wine, or 1 oz of hard liquor.  Work with your health care provider to maintain a healthy body weight or to lose weight. Ask what an ideal weight is for you.  Get at least 30 minutes of exercise that causes your heart to beat faster (aerobic exercise) most days of the week. Activities may include walking, swimming, or biking.  Work with your health care provider or diet and nutrition specialist (dietitian) to adjust your eating plan to your individual calorie needs. Reading food labels  Check food labels for the amount of sodium per serving. Choose foods with less than 5 percent of the Daily Value of sodium. Generally, foods with less than 300 mg of sodium per serving fit into this eating plan.  To find whole grains, look for the word "whole" as the first word in the ingredient list. Shopping  Buy products labeled as "low-sodium" or "no salt added."  Buy fresh foods. Avoid canned foods and premade or frozen meals. Cooking  Avoid adding salt when cooking. Use salt-free seasonings or herbs instead of table salt or sea salt. Check with your health care provider or pharmacist before using salt substitutes.  Do not fry foods. Cook foods using healthy methods such as baking, boiling, grilling, and broiling instead.  Cook with heart-healthy oils, such as olive, canola, soybean, or sunflower oil. Meal planning  Eat a balanced diet that includes: ? 5 or more servings of fruits and vegetables each day. At each meal, try to fill half of your plate with fruits and vegetables. ? Up to 6-8 servings of whole grains each day. ? Less than 6 oz of lean meat, poultry, or fish each day. A 3-oz serving of meat is about the same size as a deck of cards. One egg equals 1 oz. ? 2 servings of low-fat dairy each day. ? A serving of nuts, seeds, or beans 5 times each week. ? Heart-healthy fats. Healthy fats called Omega-3 fatty acids are found in foods such as  flaxseeds and coldwater fish, like sardines, salmon, and mackerel.  Limit how much you eat of the following: ? Canned or prepackaged foods. ? Food that is high in trans fat, such as fried foods. ? Food that is high in saturated fat, such as fatty meat. ? Sweets, desserts, sugary drinks, and other foods with added sugar. ? Full-fat dairy products.  Do not salt foods before eating.  Try to eat at least 2 vegetarian meals each week.  Eat more home-cooked food and less restaurant, buffet, and fast food.  When eating at a restaurant, ask that your food be prepared with less salt or no salt, if possible. What foods are recommended? The items listed may not be a complete list. Talk with your dietitian about what dietary choices are best for you. Grains Whole-grain or whole-wheat bread. Whole-grain or whole-wheat pasta. Brown rice. [place]. Bulgur. Whole-grain and low-sodium cereals. Pita bread. Low-fat, low-sodium crackers. Whole-wheat flour tortillas. Vegetables Fresh or frozen vegetables (raw, steamed, roasted, or grilled). Low-sodium or reduced-sodium tomato and vegetable juice. Low-sodium or reduced-sodium tomato sauce and tomato paste. Low-sodium or reduced-sodium canned vegetables. Fruits All fresh, dried, or frozen fruit. Canned fruit in natural juice (without added sugar). Meat and other protein foods Skinless chicken or [place]. Ground chicken or [place]. Pork with fat trimmed off. Fish and seafood. Egg whites. Dried beans, peas, or lentils. Unsalted nuts, nut butters, and seeds. Unsalted canned beans. Lean cuts of beef with fat trimmed off. Low-sodium, lean deli meat. Dairy Low-fat (1%) or fat-free (skim) milk. Fat-free, low-fat, or reduced-fat cheeses. Nonfat, low-sodium ricotta or cottage cheese. Low-fat or nonfat yogurt. Low-fat, low-sodium cheese. Fats and oils Soft margarine without trans fats. Vegetable oil. Low-fat, reduced-fat, or light mayonnaise and salad dressings  (reduced-sodium). Canola, safflower, olive, soybean, and sunflower oils. Avocado. Seasoning and other foods Herbs. Spices. Seasoning mixes without salt. Unsalted popcorn and pretzels. Fat-free sweets. What foods are not recommended? The items listed may not be a complete list. Talk with your dietitian about what dietary choices are best for you. Grains Baked goods made with fat, such as croissants, muffins, or some breads. Dry pasta or rice meal packs. Vegetables Creamed or fried vegetables. Vegetables in a cheese sauce. Regular canned vegetables (not low-sodium or reduced-sodium). Regular canned tomato sauce and paste (not low-sodium or reduced-sodium). Regular tomato and vegetable juice (not low-sodium or reduced-sodium). Angie Fava. Olives. Fruits Canned fruit in a light or heavy syrup. Fried fruit. Fruit in cream or butter sauce. Meat and other protein foods Fatty cuts of meat. Ribs. Fried meat. Berniece Salines. Sausage. Bologna and other processed lunch meats. Salami. Fatback. Hotdogs. Bratwurst. Salted nuts and seeds. Canned beans with added salt. Canned or smoked fish. Whole eggs or egg yolks. Chicken or [place] with skin. Dairy Whole or 2% milk, cream, and half-and-half. Whole or full-fat cream cheese. Whole-fat or sweetened yogurt.  Full-fat cheese. Nondairy creamers. Whipped toppings. Processed cheese and cheese spreads. Fats and oils Butter. Stick margarine. Lard. Shortening. Ghee. Bacon fat. Tropical oils, such as coconut, palm kernel, or palm oil. Seasoning and other foods Salted popcorn and pretzels. Onion salt, garlic salt, seasoned salt, table salt, and sea salt. Worcestershire sauce. Tartar sauce. Barbecue sauce. Teriyaki sauce. Soy sauce, including reduced-sodium. Steak sauce. Canned and packaged gravies. Fish sauce. Oyster sauce. Cocktail sauce. Horseradish that you find on the shelf. Ketchup. Mustard. Meat flavorings and tenderizers. Bouillon cubes. Hot sauce and Tabasco sauce. Premade or  packaged marinades. Premade or packaged taco seasonings. Relishes. Regular salad dressings. Where to find more information:  National Heart, Lung, and Blood Institute: PopSteam.is  American Heart Association: www.heart.org Summary  The DASH eating plan is a healthy eating plan that has been shown to reduce high blood pressure (hypertension). It may also reduce your risk for type 2 diabetes, heart disease, and stroke.  With the DASH eating plan, you should limit salt (sodium) intake to 2,300 mg a day. If you have hypertension, you may need to reduce your sodium intake to 1,500 mg a day.  When on the DASH eating plan, aim to eat more fresh fruits and vegetables, whole grains, lean proteins, low-fat dairy, and heart-healthy fats.  Work with your health care provider or diet and nutrition specialist (dietitian) to adjust your eating plan to your individual calorie needs. This information is not intended to replace advice given to you by your health care provider. Make sure you discuss any questions you have with your health care provider. Document Released: 09/01/2011 Document Revised: 09/05/2016 Document Reviewed: 09/05/2016 Elsevier Interactive Patient Education  [employer].

## 2017-12-15 LAB — BASIC METABOLIC PANEL
BUN / CREAT RATIO: 16 (ref 12–28)
BUN: 13 mg/dL (ref 8–27)
CHLORIDE: 99 mmol/L (ref 96–106)
CO2: 25 mmol/L (ref 20–29)
Calcium: 9.6 mg/dL (ref 8.7–10.3)
Creatinine, Ser: 0.81 mg/dL (ref 0.57–1.00)
GFR calc non Af Amer: 70 mL/min/{1.73_m2} (ref >59)
GFR, EST AFRICAN AMERICAN: 80 mL/min/{1.73_m2} (ref >59)
Glucose: 85 mg/dL (ref 65–99)
Potassium: 4.2 mmol/L (ref 3.5–5.2)
SODIUM: 140 mmol/L (ref 134–144)

## 2017-12-19 ENCOUNTER — Encounter: Payer: Self-pay | Admitting: Cardiovascular Disease

## 2017-12-22 ENCOUNTER — Telehealth: Payer: Self-pay | Admitting: Cardiovascular Disease

## 2017-12-22 NOTE — Telephone Encounter (Signed)
New Message:    Pt's daughter calling to follow up from a my chart email she left on 3/26.

## 2017-12-22 NOTE — Telephone Encounter (Addendum)
Spoke with daughter who had sent mychart message earlier in the week.   Wanted to make you aware of another "episode" that my mother had last night. I was just told about it today and they did not call an ambulance. Around 515 pm she went to the bathroom because she was constipated and wanted to try to have a bowel movement. She did not come back downstairs so my aunt went up to find my mom. She was still sitting on the toilet "zoned out" and just stating random numbers. My aunt asked why she was counting and then my mom started to "come to" and said she was trying to poop. She was pale and sweaty. But when prompted, she wiped herself and was able to get off the toilet on her own. My aunt got her to the bed and had her lay down for about 15 minutes. They then went downstairs and got some water and took her blood pressure. Shortly after the episode occurred her pressure was 101/63 with HR of 74. This morning it was 128/78 with HR of 81. To me it sounds like some sort of vagal syncopy episode, but am not sure and not sure what to do from here. Please  advise.   Appointment scheduled for Tuesday 12/26/17

## 2017-12-26 ENCOUNTER — Ambulatory Visit: Payer: Medicare Other | Admitting: Cardiovascular Disease

## 2017-12-26 VITALS — BP 130/72 | HR 82 | Ht 67.0 in | Wt 172.0 lb

## 2017-12-26 DIAGNOSIS — I10 Essential (primary) hypertension: Secondary | ICD-10-CM | POA: Diagnosis not present

## 2017-12-26 DIAGNOSIS — E7800 Pure hypercholesterolemia, unspecified: Secondary | ICD-10-CM | POA: Diagnosis not present

## 2017-12-26 DIAGNOSIS — I350 Nonrheumatic aortic (valve) stenosis: Secondary | ICD-10-CM

## 2017-12-26 DIAGNOSIS — R55 Syncope and collapse: Secondary | ICD-10-CM

## 2017-12-26 NOTE — Progress Notes (Signed)
Cardiology Office Note   Date:  12/26/2017   ID:  Adrienne Constableatherine Gowdy, DOB 03/15/1941, MRN [id number]  PCP:  Acquanetta BellingKelling, James, MD  Cardiologist:   Chilton Siiffany [place], MD  Neurologist: Loreli DollarIlona Humes, MD ringing she said that it was on the news recently and caused serious problems with problem  No chief complaint on file.    History of Present Illness: Adrienne Jackson is a [age] y.o. female with hypertension, hyperlipidemia, and moderate aortic stenosis here for follow-up.  She was initially seen 07/2017 for the evaluation of near syncope.  Adrienne Jackson was admitted 07/2017 for episodes of loss of consciousness.  The episodes occurred while seated at the table.  They lasted approximately 15 minutes and were associated with word finding difficulty.  There was concern for stroke versus cardiogenic syncope versus seizure.  She had a head CT, CT-A of the neck, and MRI/MRA of the brain that were negative for acute stroke. Her heart rate was in the 40s on her first admission.  Bisoprolol was discontinued and she had a recurrent event the following day, at which time she was not bradycardic. Echo that admission revealed LVEF 60-65% with normal diastolic function and mild LVH.  She also was noted to have moderate aortic stenosis (mean gradient 29 mmHg).  She followed up with Dr. Ellwood DenseAlison Rumball and was referred to cardiology for further evaluation.  She wore a 30-day event monitor that showed occasional PACs and was otherwise unremarkable.  Adrienne Jackson has been doing well since her last appointment.  Lisinopril was increased.  Her BP has been much better.  She had one episode of near syncope that occurred when sitting on the commode struggling to have a bowel movement.  She did not pass out but became lightheaded and sweaty.  Her BP was 104 systolic and she was not bradycardic.  She hasn't noted any chest pain or shortness of breath.  She continues to stay very active and has no exertional symptoms, lower  extremity edema, orthopnea or PND.     Past Medical History:  Diagnosis Date  . Essential hypertension 08/18/2017  . Hyperlipidemia   . Hypertension   . Pure hypercholesterolemia 08/18/2017  . Syncope 08/18/2017  . Thyroid disease     Past Surgical History:  Procedure Laterality Date  . ABDOMINAL HYSTERECTOMY       Current Outpatient Medications  Medication Sig Dispense Refill  . aspirin EC 81 MG tablet Take 81 mg by mouth daily.    . chlorthalidone (HYGROTON) 25 MG tablet Take 0.5 tablets (12.5 mg total) by mouth daily. Replace  HCTZ 15 tablet 3  . cholecalciferol (VITAMIN D) 1000 units tablet Take 1,000 Units by mouth daily.    . divalproex (DEPAKOTE) 250 MG DR tablet Take 250 mg by mouth at bedtime.    . hydrochlorothiazide (MICROZIDE) 12.5 MG capsule Take 1 capsule (12.5 mg total) by mouth daily. 90 capsule 3  . levothyroxine (SYNTHROID, LEVOTHROID) 75 MCG tablet Take 75 mcg by mouth daily before breakfast.    . lisinopril (PRINIVIL,ZESTRIL) 20 MG tablet Take 1 tablet (20 mg total) by mouth daily. 30 tablet 4  . rosuvastatin (CRESTOR) 20 MG tablet Take 20 mg by mouth daily.     No current facility-administered medications for this visit.     Allergies:   Patient has no known allergies.    Social History:  The patient  reports that she has never smoked. She has never used smokeless tobacco. She reports that she does not  drink alcohol or use drugs.   Family History:  The patient's family history includes Heart disease in her father.    ROS:  Please see the history of present illness.   Otherwise, review of systems are positive for none.   All other systems are reviewed and negative.    PHYSICAL EXAM: VS:  BP 130/72   Pulse 82   Ht 5\' 7"  (1.702 m)   Wt 172 lb (78 kg)   BMI 26.94 kg/m  , BMI Body mass index is 26.94 kg/m. GENERAL:  Well appearing HEENT: Pupils equal round and reactive, fundi not visualized, oral mucosa unremarkable NECK:  No jugular venous  distention, waveform within normal limits, carotid upstroke brisk and symmetric, no bruits LUNGS:  Clear to auscultation bilaterally HEART:  RRR.  PMI not displaced or sustained,S1 and S2 within normal limits, no S3, no S4, no clicks, no rubs, no murmurs ABD:  Flat, positive bowel sounds normal in frequency in pitch, no bruits, no rebound, no guarding, no midline pulsatile mass, no hepatomegaly, no splenomegaly EXT:  2 plus pulses throughout, no edema, no cyanosis no clubbing SKIN:  No rashes no nodules NEURO:  Cranial nerves II through XII grossly intact, motor grossly intact throughout PSYCH:  Cognitively intact, oriented to person place and time    EKG:  EKG is ordered today. The ekg ordered 09/22/17 demonstrates sinus rhythm.  Rate 90 bpm.   12/26/17: Sinus rhythm.  Rate 82 bpm.    Echo 08/18/17: Study Conclusions  - Left ventricle: The cavity size was normal. Wall thickness was   increased in a pattern of mild LVH. Systolic function was normal.   The estimated ejection fraction was in the range of 60% to 65%.   Left ventricular diastolic function parameters were normal. - Aortic valve: There was moderate stenosis. Valve area (VTI): 1.52   cm^2. Valve area (Vmax): 1.63 cm^2. Valve area (Vmean): 1.37   cm^2. - Left atrium: The atrium was mildly dilated. - Atrial septum: A patent foramen ovale cannot be excluded.Study Conclusions  30-day event Monitor 08/24/17:  Quality: Fair.  Baseline artifact. Predominant rhythm: Sinus rhythm Average heart rate: 82 bpm Max heart rate: 124 bpm Min heart rate: 52 bpm  Occasional PACs noted  Recent Labs: 08/18/2017: TSH 2.758 08/19/2017: Hemoglobin 12.2; Platelets 143 12/14/2017: BUN 13; Creatinine, Ser 0.81; Potassium 4.2; Sodium 140    Lipid Panel    Component Value Date/Time   CHOL 128 08/18/2017 0247   TRIG 52 08/18/2017 0247   HDL 69 08/18/2017 0247   CHOLHDL 1.9 08/18/2017 0247   VLDL 10 08/18/2017 0247   LDLCALC 49  08/18/2017 0247      Wt Readings from Last 3 Encounters:  12/26/17 172 lb (78 kg)  10/23/17 181 lb 9.6 oz (82.4 kg)  09/22/17 178 lb 6.4 oz (80.9 kg)      ASSESSMENT AND PLAN:  # Syncope vs. Seizure: Likely bradycardia related. No recurrent bradycardia since stopping metoprolol.  She had an episode of near syncope while having a bowel movement that was vagal mediated.  Discussed the importance of hydration.   # Hypertension: Blood pressure has been better.  Continue lisinopril, and HCTZ.  # Moderate aortic stenosis: Mean gradient 29 mmHg.  She is asymptomatic.  Repeat echo 07/2018.  # Hyperlipidemia: LDL 49 07/2017.  Continue rosuvastatin.   Current medicines are reviewed at length with the patient today.  The patient does not have concerns regarding medicines.  The following changes have been made:  none  Labs/ tests ordered today include:   No orders of the defined types were placed in this encounter.    Disposition:   FU with Rolin Schult C. Duke Salvia, MD, [facility] 07/2018.   Signed, Jaidan Prevette C. Duke Salvia, MD, [facility]  12/26/2017 10:34 AM    [facility]

## 2017-12-26 NOTE — Patient Instructions (Signed)
Medication Instructions:  Your physician recommends that you continue on your current medications as directed. Please refer to the Current Medication list given to you today.  Labwork: NONE  Testing/Procedures: Your physician has requested that you have an echocardiogram. Echocardiography is a painless test that uses sound waves to create images of your heart. It provides your doctor with information about the size and shape of your heart and how well your heart's chambers and valves are working. This procedure takes approximately one hour. There are no restrictions for this procedure. [facility] AT [address] IN November   Follow-Up: Your physician wants you to follow-up in: AFTER ECHO IN November  You will receive a reminder letter in the mail two months in advance. If you don't receive a letter, please call our office to schedule the follow-up appointment.  If you need a refill on your cardiac medications before your next appointment, please call your pharmacy.

## 2017-12-28 ENCOUNTER — Encounter: Payer: Self-pay | Admitting: Cardiovascular Disease

## 2017-12-28 ENCOUNTER — Telehealth: Payer: Self-pay | Admitting: *Deleted

## 2017-12-28 MED ORDER — CHLORTHALIDONE 25 MG PO TABS: 12.5000 mg | ORAL_TABLET | Freq: Every day | ORAL | 3 refills | Status: DC

## 2017-12-28 NOTE — Addendum Note (Signed)
Addended by: Regis BillPRATT, Osborne Serio B on: 12/28/2017 02:25 PM   Modules accepted: Orders

## 2017-12-28 NOTE — Telephone Encounter (Signed)
Received refill request for Chlorthalidone via mychart. Dr Duke Salviaandolph reviewed and wanted to make sure patient is not taking Chlorthalidone and HCTZ both. Patient was to d/c HCTZ when she started Chlorthalidone. If patient is currently taking both Dr Duke Salviaandolph recommends for her to d/c HCTZ and increase Lisinopril to 40 mg daily  Left message for daughter to call back.

## 2017-12-28 NOTE — Telephone Encounter (Signed)
Spoke with daughter and patient is only taking Chlorthalidone

## 2018-01-04 ENCOUNTER — Ambulatory Visit: Payer: Medicare Other

## 2018-01-04 ENCOUNTER — Other Ambulatory Visit (HOSPITAL_COMMUNITY): Payer: Medicare Other

## 2018-01-28 ENCOUNTER — Encounter: Payer: Self-pay | Admitting: Cardiovascular Disease

## 2018-01-29 ENCOUNTER — Telehealth: Payer: Self-pay | Admitting: *Deleted

## 2018-01-29 MED ORDER — CHLORTHALIDONE 25 MG PO TABS: 12.5000 mg | ORAL_TABLET | Freq: Every day | ORAL | 0 refills | Status: DC

## 2018-01-29 MED ORDER — LISINOPRIL 20 MG PO TABS: 20.0000 mg | ORAL_TABLET | Freq: Every day | ORAL | 3 refills | Status: DC

## 2018-01-29 MED ORDER — CHLORTHALIDONE 25 MG PO TABS: 12.5000 mg | ORAL_TABLET | Freq: Every day | ORAL | 3 refills | Status: DC

## 2018-01-29 MED ORDER — LISINOPRIL 20 MG PO TABS
20.0000 mg | ORAL_TABLET | Freq: Every day | ORAL | 0 refills | Status: DC
Start: 2018-01-29 — End: 2018-01-29

## 2018-01-29 NOTE — Telephone Encounter (Signed)
Lisinopril and Chlorthalidone sent into [facility] per the patient request (enough for two weeks until delivery from [facility]).   Refills have been [facility] as [facility].

## 2018-02-20 ENCOUNTER — Ambulatory Visit: Payer: Medicare Other | Admitting: Cardiovascular Disease

## 2018-07-02 ENCOUNTER — Other Ambulatory Visit: Payer: Self-pay | Admitting: Family Medicine

## 2018-07-02 ENCOUNTER — Encounter: Payer: Self-pay | Admitting: Neurology

## 2018-07-02 DIAGNOSIS — Z1231 Encounter for screening mammogram for malignant neoplasm of breast: Secondary | ICD-10-CM

## 2018-07-27 ENCOUNTER — Other Ambulatory Visit (HOSPITAL_COMMUNITY): Payer: Medicare Other

## 2018-08-09 ENCOUNTER — Ambulatory Visit
Admission: RE | Admit: 2018-08-09 | Discharge: 2018-08-09 | Disposition: A | Discharge status: Home / Self Care | Payer: Medicare Other | Source: Ambulatory Visit | Attending: Family Medicine | Admitting: Family Medicine

## 2018-08-09 ENCOUNTER — Ambulatory Visit: Payer: Medicare Other

## 2018-08-09 ENCOUNTER — Ambulatory Visit (HOSPITAL_COMMUNITY): Payer: Medicare Other | Attending: Internal Medicine

## 2018-08-09 ENCOUNTER — Other Ambulatory Visit: Payer: Self-pay

## 2018-08-09 DIAGNOSIS — Z1231 Encounter for screening mammogram for malignant neoplasm of breast: Secondary | ICD-10-CM

## 2018-08-09 DIAGNOSIS — I350 Nonrheumatic aortic (valve) stenosis: Secondary | ICD-10-CM | POA: Diagnosis present

## 2018-09-10 ENCOUNTER — Encounter: Payer: Self-pay | Admitting: Neurology

## 2018-09-10 ENCOUNTER — Ambulatory Visit: Payer: Medicare Other | Admitting: Neurology

## 2018-09-10 ENCOUNTER — Other Ambulatory Visit: Payer: Self-pay

## 2018-09-10 VITALS — BP 132/64 | HR 82 | Ht 67.0 in | Wt 183.0 lb

## 2018-09-10 DIAGNOSIS — G3109 Other frontotemporal dementia: Secondary | ICD-10-CM | POA: Diagnosis not present

## 2018-09-10 DIAGNOSIS — F0280 Dementia in other diseases classified elsewhere without behavioral disturbance: Secondary | ICD-10-CM

## 2018-09-10 MED ORDER — ESCITALOPRAM OXALATE 20 MG PO TABS: 20.0000 mg | ORAL_TABLET | Freq: Every day | ORAL | 3 refills | Status: DC

## 2018-09-10 NOTE — Progress Notes (Signed)
NEUROLOGY CONSULTATION NOTE  Adrienne Jackson MRN: [id number] DOB: 04/29/1941  Referring provider: Dr. Acquanetta BellingJames Kelling Primary care provider: Dr. Juluis RainierElizabeth Barnes  Reason for consult:  dementia  Dear Dr Zachery DauerBarnes:  Thank you for your kind referral of Adrienne Jackson for consultation of the above symptoms. Although her history is well known to you, please allow me to reiterate it for the purpose of our medical record. The patient was accompanied to the clinic by her daughter Adrienne Jackson who also provides collateral information. Records and images were personally reviewed where available.  HISTORY OF PRESENT ILLNESS: This is a pleasant 77 year old right-handed woman with a history of hypertension, hyperlipidemia, hypothyroidism, presenting to establish care for dementia. [place] from [place] were reviewed. Her daughter reports that the diagnosis of frontotemporal dementia came about when she started having episodes of passing out and slumping over in May 2018. She initially saw neurologist Dr. Glenda ChromanAmiri and had a normal 72-hour EEG. MRI brain showed global atrophic changes. It was also felt she had underlying dementia at that point. She had 2 episodes in one day last Thanksgiving 2018 where she had a CTA head and neck and another MRI brain which were unremarkable. She was started on Keppra for presumed seizures. She was evaluated by epileptologist Dr. Jobe GibbonHumes and had inpatient vEEG monitoring which showed left hemisphere slowing, particularly involving the frontotemporal parietal region, no epileptiform discharges. MRI in April 2018 showed generalized atrophic changes significantly worse across left hemisphere. It was felt that EEG did not support diagnosis of epilepsy, and that syncopal events were related to BP fluctuations. Keppra was discontinued and Depakote started for mood, anxiety, and sleep. She underwent Neuropsychological testing in April 2019 with findings consistent with a likely diagnosis of  the language variant of frontotemporal dementia.   Her daughter reports that she has not had any further syncopal episodes since BP medications were adjusted in November 2018. Over the past year, she has noticed more cognitive decline, needing to repeat instructions, difficulty with multi-step commands. She would get the wrong thing from the fridge. She was making fish last Christmas and could not understand the recipe card. She has more word-finding difficulties, causing more anxiety due to difficulty communicating. She is on Lexapro 10mg  daily without side effects. She stopped driving in May 2019 after driving evaluation recommended no further driving. She moved in with her sister living in [place] who manages her medications. She is independent with dressing and bathing. No paranoia or hallucinations. She denies any headaches, dizziness, vision changes, focal numbness/tingling/weakness, neck/back pain, bowel/bladder dysfunction, anosmia, or tremors. No further staring/unresponsive episodes per daughter. She denies any olfactory/gustatory hallucinations, myoclonic jerks. No family history of dementia, no history of concussions. She drinks alcohol socially.   PAST MEDICAL HISTORY: Past Medical History:  Diagnosis Date  . Essential hypertension 08/18/2017  . Hyperlipidemia   . Hypertension   . Pure hypercholesterolemia 08/18/2017  . Syncope 08/18/2017  . Thyroid disease     PAST SURGICAL HISTORY: Past Surgical History:  Procedure Laterality Date  . ABDOMINAL HYSTERECTOMY      MEDICATIONS: Current Outpatient Medications on File Prior to Visit  Medication Sig Dispense Refill  . aspirin EC 81 MG tablet Take 81 mg by mouth daily.    . chlorthalidone (HYGROTON) 25 MG tablet Take 0.5 tablets (12.5 mg total) by mouth daily. Replaces HCTZ 45 tablet 3  . cholecalciferol (VITAMIN D) 1000 units tablet Take 1,000 Units by mouth daily.    Marland Kitchen. escitalopram (LEXAPRO) 10 MG  tablet Take 10 mg by mouth  daily.    Marland Kitchen levothyroxine (SYNTHROID, LEVOTHROID) 88 MCG tablet Take 88 mcg by mouth daily before breakfast.    . rosuvastatin (CRESTOR) 20 MG tablet Take 20 mg by mouth daily.     No current facility-administered medications on file prior to visit.     ALLERGIES: No Known Allergies  FAMILY HISTORY: Family History  Problem Relation Age of Onset  . Heart disease Father     SOCIAL HISTORY: Social History   Socioeconomic History  . Marital status: Single    Spouse name: Not on file  . Number of children: Not on file  . Years of education: Not on file  . Highest education level: Not on file  Occupational History  . Not on file  Social Needs  . Financial resource strain: Not on file  . Food insecurity:    Worry: Not on file    Inability: Not on file  . Transportation needs:    Medical: Not on file    Non-medical: Not on file  Tobacco Use  . Smoking status: Never Smoker  . Smokeless tobacco: Never Used  Substance and Sexual Activity  . Alcohol use: No    Frequency: Never  . Drug use: No  . Sexual activity: Not on file  Lifestyle  . Physical activity:    Days per week: Not on file    Minutes per session: Not on file  . Stress: Not on file  Relationships  . Social connections:    Talks on phone: Not on file    Gets together: Not on file    Attends religious service: Not on file    Active member of club or organization: Not on file    Attends meetings of clubs or organizations: Not on file    Relationship status: Not on file  . Intimate partner violence:    Fear of current or ex partner: Not on file    Emotionally abused: Not on file    Physically abused: Not on file    Forced sexual activity: Not on file  Other Topics Concern  . Not on file  Social History Narrative  . Not on file    REVIEW OF SYSTEMS: Constitutional: No fevers, chills, or sweats, no generalized fatigue, change in appetite Eyes: No visual changes, double vision, eye pain Ear, nose and  throat: No hearing loss, ear pain, nasal congestion, sore throat Cardiovascular: No chest pain, palpitations Respiratory:  No shortness of breath at rest or with exertion, wheezes GastrointestinaI: No nausea, vomiting, diarrhea, abdominal pain, fecal incontinence Genitourinary:  No dysuria, urinary retention or frequency Musculoskeletal:  No neck pain, back pain Integumentary: No rash, pruritus, skin lesions Neurological: as above Psychiatric: No depression, insomnia, anxiety Endocrine: No palpitations, fatigue, diaphoresis, mood swings, change in appetite, change in weight, increased thirst Hematologic/Lymphatic:  No anemia, purpura, petechiae. Allergic/Immunologic: no itchy/runny eyes, nasal congestion, recent allergic reactions, rashes  PHYSICAL EXAM: Vitals:   09/10/18 1419  BP: 132/64  Pulse: 82  SpO2: 98%   General: No acute distress Head:  Normocephalic/atraumatic Eyes: Fundoscopic exam shows bilateral sharp discs, no vessel changes, exudates, or hemorrhages Neck: supple, no paraspinal tenderness, full range of motion Back: No paraspinal tenderness Heart: regular rate and rhythm Lungs: Clear to auscultation bilaterally. Vascular: No carotid bruits. Skin/Extremities: No rash, no edema Neurological Exam: Mental status: alert and oriented to person, year. She has mild expressive aphasia with occasional paraphasic errors. Fund of knowledge is appropriate but  difficulty communicating it.  Recent and remote memory are impaired.  Attention and concentration are reduced.  Able to name objects (called wrist watch "wrist one"), able to repeat phrases.  MMSE - Mini Mental State Exam 09/10/2018  Orientation to time 1  Orientation to Place 0  Registration 3  Attention/ Calculation 1  Recall 0  Language- name 2 objects 1  Language- repeat 1  Language- follow 3 step command 3  Language- read & follow direction 0  Write a sentence 1  Copy design 1  Total score 12   Cranial  nerves: CN I: not tested CN II: pupils equal, round and reactive to light, visual fields intact, fundi unremarkable. CN III, IV, VI:  full range of motion, no nystagmus, no ptosis CN V: facial sensation intact CN VII: upper and lower face symmetric CN VIII: hearing intact to finger rub CN IX, X: gag intact, uvula midline CN XI: sternocleidomastoid and trapezius muscles intact CN XII: tongue midline Bulk & Tone: normal, no fasciculations. Motor: 5/5 throughout with no pronator drift. Sensation: intact to light touch, cold, pin, vibration and joint position sense.  No extinction to double simultaneous stimulation.  Romberg test negative Deep Tendon Reflexes: +2 throughout, no ankle clonus Plantar responses: downgoing bilaterally Cerebellar: no incoordination on finger to nose, heel to shin. No dysdiadochokinesia Gait: narrow-based and steady, able to tandem walk adequately. Tremor: none  IMPRESSION: This is a pleasant 77 year old right-handed woman with a history of hypertension, hyperlipidemia, who was having syncopal episodes in 2018 that have not occurred since November 2018 when BP medications were adjusted. Prolonged EEG studies did not show evidence of epilepsy, there was left hemisphere slowing on EEG, MRI brain showed diffuse atrophy, more prominent on the left hemisphere. As part of her workup, she was found to have dementia with Neuropsychological testing indicating language variant of frontotemporal dementia. MMSE today 12/30, she is noted to have mild expressive aphasia. We discussed medications used in FTD, SSRIs have shown some benefit, increase Lexapro to 20mg  daily. We discussed how cholinesterase inhibitors have not shown usefulness with FTD, there is a trial of galantamine suggested a trend toward benefit in patients with primary progressive aphasia, but have agreed to hold off for now. We discussed speech therapy may be of some benefit, referral will be sent. She does not drive.  Continue 24/7 supervision with medications. Follow-up in 6 months, they know to call for any changes.   Thank you for allowing me to participate in the care of this patient. Please do not hesitate to call for any questions or concerns.   Patrcia Dolly, M.D.  CC: Dr. Zachery Dauer, Dr. Danella Deis

## 2018-09-10 NOTE — Patient Instructions (Signed)
1. Increase Lexapro to 20mg  daily. With your current prescription of Lexapro 10mg , take 2 tablets daily. Once done, your new bottle will be for Lexapro 20mg , take 1 tablet daily  2. Refer for home speech therapy  3. Follow-up in 6 months, call for any changes  FALL PRECAUTIONS: Be cautious when walking. Scan the area for obstacles that may increase the risk of trips and falls. When getting up in the mornings, sit up at the edge of the bed for a few minutes before getting out of bed. Consider elevating the bed at the head end to avoid drop of blood pressure when getting up. Walk always in a well-lit room (use night lights in the walls). Avoid area rugs or power cords from appliances in the middle of the walkways. Use a walker or a cane if necessary and consider physical therapy for balance exercise. Get your eyesight checked regularly.   HOME SAFETY: Consider the safety of the kitchen when operating appliances like stoves, microwave oven, and blender. Consider having supervision and share cooking responsibilities until no longer able to participate in those. Accidents with firearms and other hazards in the house should be identified and addressed as well.   ABILITY TO BE LEFT ALONE: If patient is unable to contact 911 operator, consider using LifeLine, or when the need is there, arrange for someone to stay with patients. Smoking is a fire hazard, consider supervision or cessation. Risk of wandering should be assessed by caregiver and if detected at any point, supervision and safe proof recommendations should be instituted.  RECOMMENDATIONS FOR ALL PATIENTS WITH MEMORY PROBLEMS: 1. Continue to exercise (Recommend 30 minutes of walking everyday, or 3 hours every week) 2. Increase social interactions - continue going to [place] and enjoy social gatherings with friends and family 3. Eat healthy, avoid fried foods and eat more fruits and vegetables 4. Maintain adequate blood pressure, blood sugar, and  blood cholesterol level. Reducing the risk of stroke and cardiovascular disease also helps promoting better memory. 5. Avoid stressful situations. Live a simple life and avoid aggravations. Organize your time and prepare for the next day in anticipation. 6. Sleep well, avoid any interruptions of sleep and avoid any distractions in the bedroom that may interfere with adequate sleep quality 7. Avoid sugar, avoid sweets as there is a strong link between excessive sugar intake, diabetes, and cognitive impairment The Mediterranean diet has been shown to help patients reduce the risk of progressive memory disorders and reduces cardiovascular risk. This includes eating fish, eat fruits and green leafy vegetables, nuts like almonds and hazelnuts, walnuts, and also use olive oil. Avoid fast foods and fried foods as much as possible. Avoid sweets and sugar as sugar use has been linked to worsening of memory function.  There is always a concern of gradual progression of memory problems. If this is the case, then we may need to adjust level of care according to patient needs. Support, both to the patient and caregiver, should then be put into place.

## 2018-09-12 ENCOUNTER — Other Ambulatory Visit: Payer: Self-pay | Admitting: Gastroenterology

## 2018-09-12 DIAGNOSIS — Z1211 Encounter for screening for malignant neoplasm of colon: Secondary | ICD-10-CM

## 2018-10-09 ENCOUNTER — Ambulatory Visit: Payer: Medicare Other

## 2018-11-30 ENCOUNTER — Ambulatory Visit
Admission: RE | Admit: 2018-11-30 | Discharge: 2018-11-30 | Disposition: A | Discharge status: Home / Self Care | Payer: Medicare Other | Source: Ambulatory Visit | Attending: Gastroenterology | Admitting: Gastroenterology

## 2018-11-30 ENCOUNTER — Ambulatory Visit: Payer: Medicare Other

## 2018-11-30 DIAGNOSIS — Z1211 Encounter for screening for malignant neoplasm of colon: Secondary | ICD-10-CM

## 2019-02-01 MED ORDER — CHLORTHALIDONE 25 MG PO TABS: 12.5000 mg | ORAL_TABLET | Freq: Every day | ORAL | 0 refills | Status: DC

## 2019-02-01 MED ORDER — ROSUVASTATIN CALCIUM 20 MG PO TABS: 20.0000 mg | ORAL_TABLET | Freq: Every day | ORAL | 0 refills | Status: DC

## 2019-03-12 IMAGING — CT CT VIRTUAL COLONOSCOPY SCREENING
2 of 9 series · 12 of 46 positions shown, 14 images · non-contrast
Comparison: None

CLINICAL DATA: Screening

EXAM:
CT VIRTUAL COLONOSCOPY SCREENING
TECHNIQUE: The patient was given a standard bowel preparation with Gastrografin
and barium for fluid and stool tagging respectively. The quality of
the bowel preparation is moderate. Automated CO2 insufflation of the
colon was performed prior to image acquisition and colonic
distention is good. Image post processing was used to generate a 3D
endoluminal fly-through projection of the colon and to
electronically subtract stool/fluid as appropriate.

[Series 6: supine colon 1.50 br40 s3 supine thins · axial · 0.70mm/px · z∈[+1404,+1826]mm · 9 of 353 slices shown, 11 images]
[im 36/353  soft-tissue]
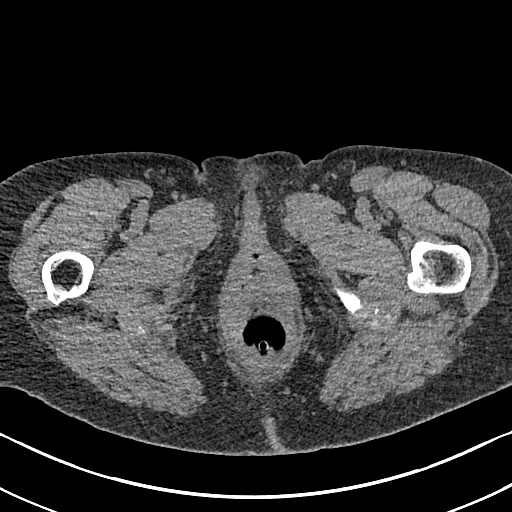
[im 36/353  bone]
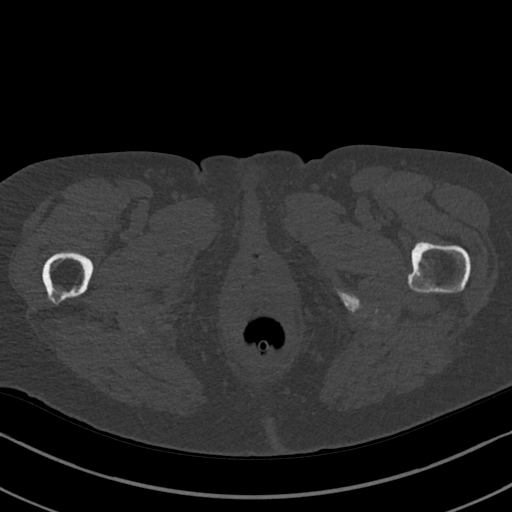
[im 71/353  soft-tissue]
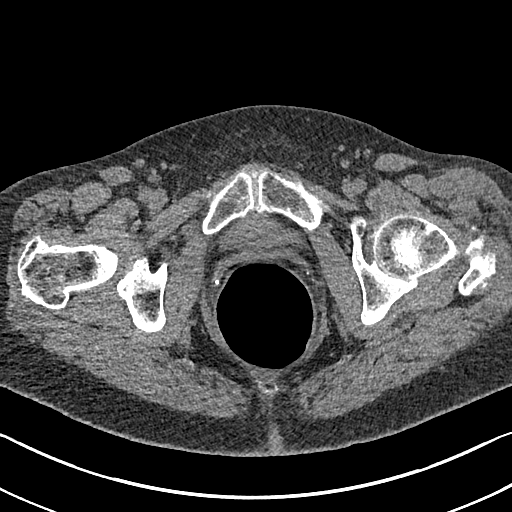
[im 106/353  soft-tissue]
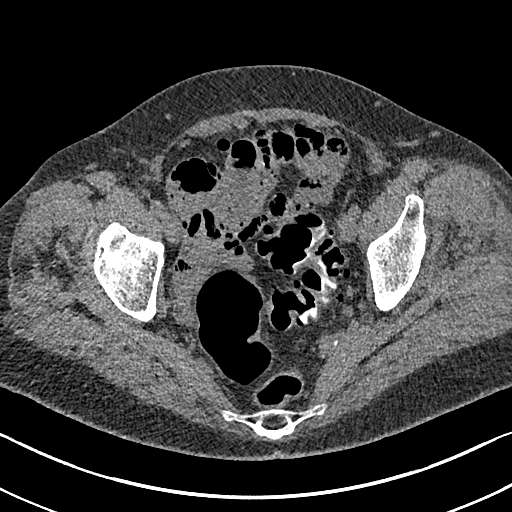
[im 141/353  soft-tissue]
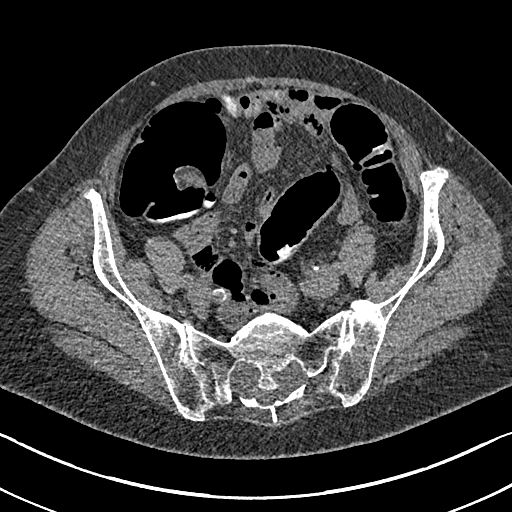
[im 177/353  soft-tissue]
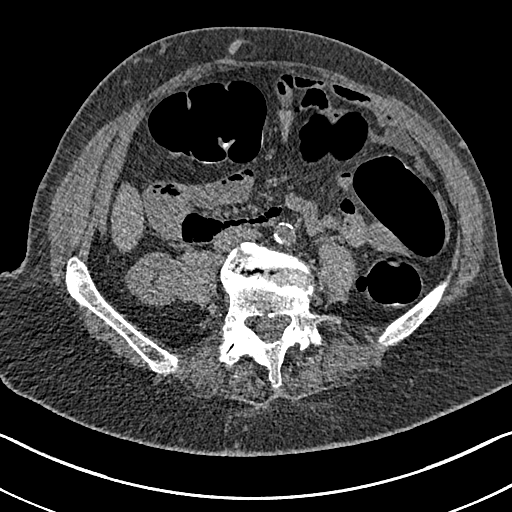
[im 212/353  soft-tissue]
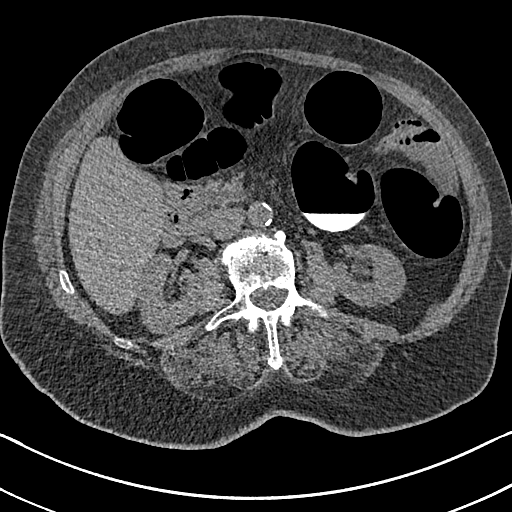
[im 247/353  soft-tissue]
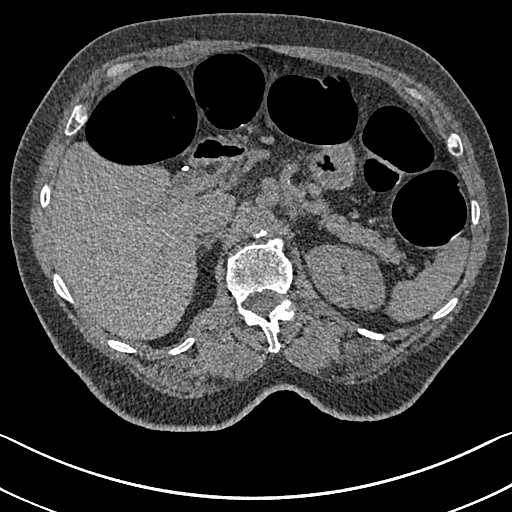
[im 282/353  soft-tissue]
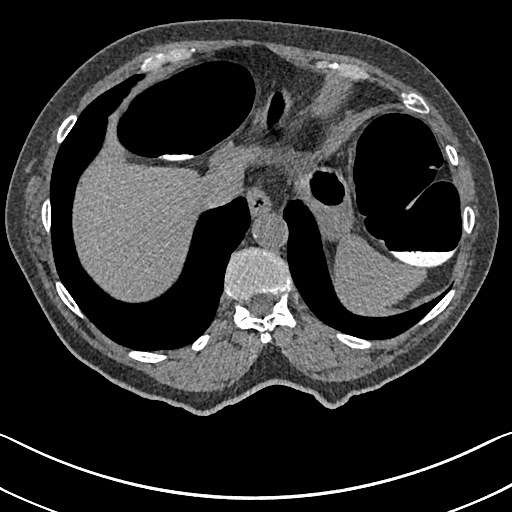
[im 317/353  soft-tissue]
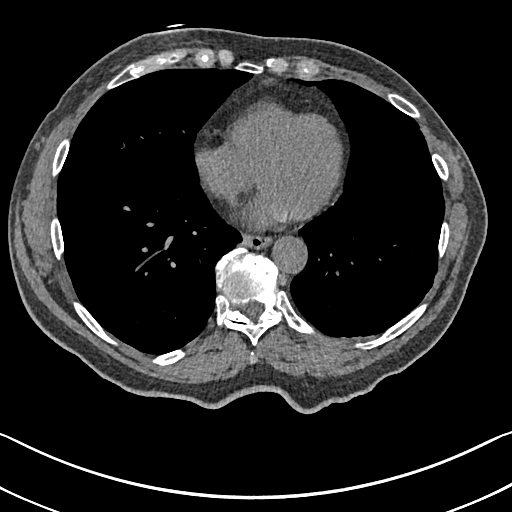
[im 317/353  bone]
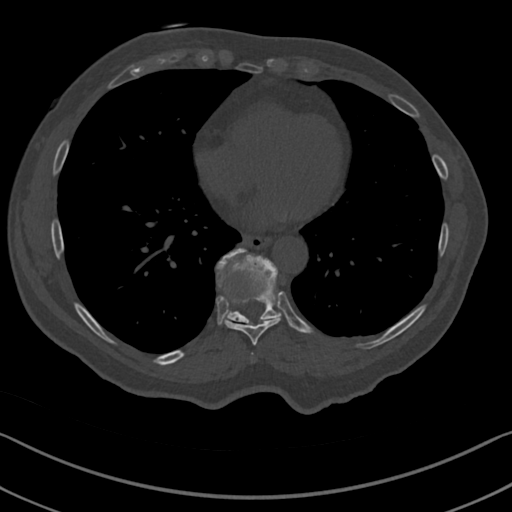

[Series 8: supine colon 3.00 br40 s3 cor cor supine · coronal · 0.70mm/px · 3 of 117 slices shown]
[im 30/117  soft-tissue]
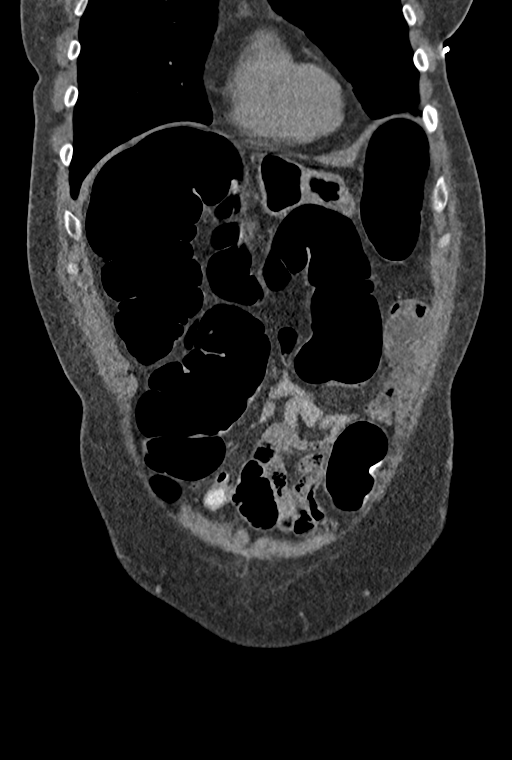
[im 59/117  soft-tissue]
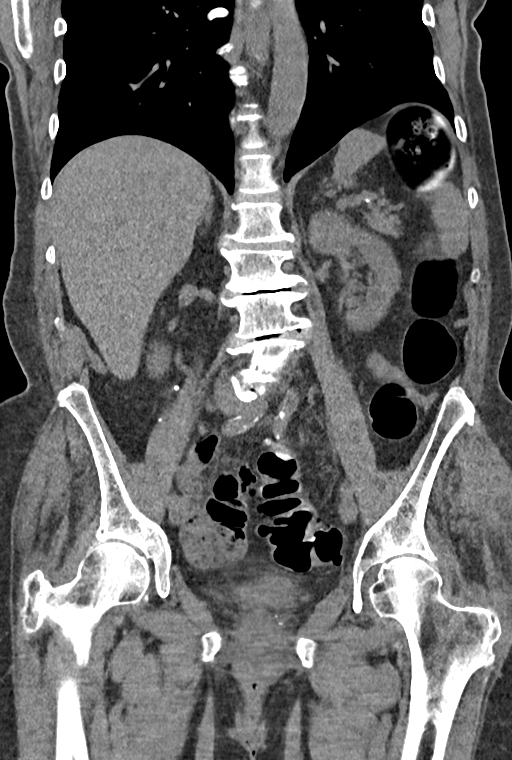
[im 88/117  soft-tissue]
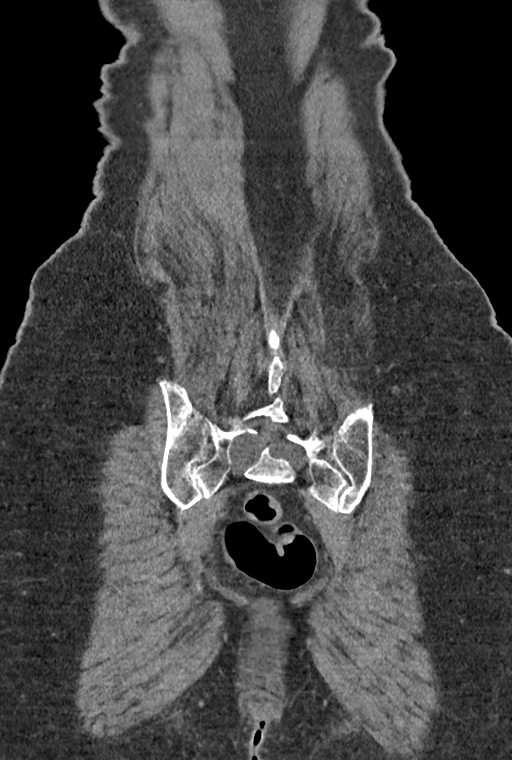

[12 of 46 positions shown; findings below may reference images not displayed]

FINDINGS: VIRTUAL COLONOSCOPY

Mild retained barium and barium tagged stool throughout the colon.
Extensive sigmoid diverticulosis with under distention of the
sigmoid colon. Scattered diverticula elsewhere throughout the colon.
No visible annular constricting lesions. No visible fixed non barium
tagged polypoid filling defects. Tortuosity of the colon.

Virtual colonoscopy is not designed to detect diminutive polyps
(i.e., less than or equal to 5 mm), the presence or absence of which
may not affect clinical management.

CT ABDOMEN AND PELVIS WITHOUT CONTRAST

Lower chest: Lung bases are clear. No effusions. Heart is normal
size. Coronary artery and aortic calcifications.

Hepatobiliary: No focal liver abnormality is seen. Status post
cholecystectomy. No biliary dilatation.

Pancreas: No focal abnormality or ductal dilatation.

Spleen: No focal abnormality.  Normal size.

Adrenals/Urinary Tract: No adrenal abnormality. No focal renal
abnormality. No stones or hydronephrosis. Urinary bladder is
unremarkable.

Stomach/Bowel: Stomach and small bowel decompressed, unremarkable.

Vascular/Lymphatic: Nya calcified aorta and iliac vessels. No
evidence of aneurysm or adenopathy.

Reproductive: Prior hysterectomy.  No adnexal masses.

Other: No free fluid or free air.

Musculoskeletal: No acute bony abnormality. Degenerative changes in
the lumbar spine.
IMPRESSION: Diverticulosis, most pronounced in the sigmoid colon where there Is
under distention. No visible fixed annular constricting lesions or
non barium tagged polypoid filling defects.

Coronary artery disease, aortic atherosclerosis.

Prior cholecystectomy and hysterectomy.

No acute extra colonic abnormality.

## 2019-03-19 ENCOUNTER — Other Ambulatory Visit: Payer: Self-pay | Admitting: Cardiovascular Disease

## 2019-04-10 ENCOUNTER — Ambulatory Visit: Payer: Medicare Other | Admitting: Neurology

## 2019-04-18 ENCOUNTER — Other Ambulatory Visit: Payer: Self-pay | Admitting: Cardiovascular Disease

## 2019-04-19 ENCOUNTER — Telehealth (INDEPENDENT_AMBULATORY_CARE_PROVIDER_SITE_OTHER): Payer: Medicare Other | Admitting: Neurology

## 2019-04-19 ENCOUNTER — Other Ambulatory Visit: Payer: Self-pay

## 2019-04-19 ENCOUNTER — Encounter: Payer: Self-pay | Admitting: Neurology

## 2019-04-19 VITALS — Ht 67.0 in | Wt 180.0 lb

## 2019-04-19 DIAGNOSIS — R4789 Other speech disturbances: Secondary | ICD-10-CM | POA: Diagnosis not present

## 2019-04-19 DIAGNOSIS — G3109 Other frontotemporal dementia: Secondary | ICD-10-CM | POA: Diagnosis not present

## 2019-04-19 DIAGNOSIS — F0280 Dementia in other diseases classified elsewhere without behavioral disturbance: Secondary | ICD-10-CM

## 2019-04-19 MED ORDER — ESCITALOPRAM OXALATE 20 MG PO TABS: ORAL_TABLET | ORAL | 3 refills | Status: DC

## 2019-04-19 NOTE — Progress Notes (Signed)
Virtual Visit via Video Note The purpose of this virtual visit is to provide medical care while limiting exposure to the novel coronavirus.    Consent was obtained for video visit:  Yes.   Answered questions that patient had about telehealth interaction:  Yes.   I discussed the limitations, risks, security and privacy concerns of performing an evaluation and management service by telemedicine. I also discussed with the patient that there may be a patient responsible charge related to this service. The patient expressed understanding and agreed to proceed.  Pt location: Home Physician Location: office Name of referring provider:  Acquanetta BellingKelling, James, MD I connected with Venetia Constableatherine Hullum at patients initiation/request on 04/19/2019 at  8:30 AM EDT by video enabled telemedicine application and verified that I am speaking with the correct person using two identifiers. Pt MRN:  [id number] Pt DOB:  09/30/1940 Video Participants:  Venetia Constableatherine Carlberg;  Juanetta GoslingLisa Guarino (daughter)   History of Present Illness:  The patient was seen as a virtual video visit on 05/01/2019. She was last seen 8 months ago for frontotemporal dementia. Her daughter is present to provide additional information, her sister was also present. MMSE 12/30 in 08/2018. She lives with her sister who manages her medications. Her daughter manages finances. She feels she is doing okay, her daughter has noticed more decline with language since her last visit. She is independent with dressing and bathing. She does not drive. She can get a little irritated with her sister at times, but this does not last long. No paranoia or hallucinations. She is on Lexapro 20mg  daily without side effects. Sleep is good. No wandering behavior. No paranoia or hallucinations. No further syncopal episodes since 2018. She denies any headaches, dizziness, vision changes, no significant falls.  History on Initial Assessment 09/10/2018: This is a pleasant 78 year old  right-handed woman with a history of hypertension, hyperlipidemia, hypothyroidism, presenting to establish care for dementia. [place] from [place] were reviewed. Her daughter reports that the diagnosis of frontotemporal dementia came about when she started having episodes of passing out and slumping over in May 2018. She initially saw neurologist Dr. Glenda ChromanAmiri and had a normal 72-hour EEG. MRI brain showed global atrophic changes. It was also felt she had underlying dementia at that point. She had 2 episodes in one day last Thanksgiving 2018 where she had a CTA head and neck and another MRI brain which were unremarkable. She was started on Keppra for presumed seizures. She was evaluated by epileptologist Dr. Jobe GibbonHumes and had inpatient vEEG monitoring which showed left hemisphere slowing, particularly involving the frontotemporal parietal region, no epileptiform discharges. MRI in April 2018 showed generalized atrophic changes significantly worse across left hemisphere. It was felt that EEG did not support diagnosis of epilepsy, and that syncopal events were related to BP fluctuations. Keppra was discontinued and Depakote started for mood, anxiety, and sleep. She underwent Neuropsychological testing in April 2019 with findings consistent with a likely diagnosis of the language variant of frontotemporal dementia.   Her daughter reports that she has not had any further syncopal episodes since BP medications were adjusted in November 2018. Over the past year, she has noticed more cognitive decline, needing to repeat instructions, difficulty with multi-step commands. She would get the wrong thing from the fridge. She was making fish last Christmas and could not understand the recipe card. She has more word-finding difficulties, causing more anxiety due to difficulty communicating. She is on Lexapro 10mg  daily without side effects. She stopped driving  in May 2019 after driving evaluation recommended no further driving.  She moved in with her sister living in [place] who manages her medications. She is independent with dressing and bathing. No paranoia or hallucinations. She denies any headaches, dizziness, vision changes, focal numbness/tingling/weakness, neck/back pain, bowel/bladder dysfunction, anosmia, or tremors. No further staring/unresponsive episodes per daughter. She denies any olfactory/gustatory hallucinations, myoclonic jerks. No family history of dementia, no history of concussions. She drinks alcohol socially.     Current Outpatient Medications on File Prior to Visit  Medication Sig Dispense Refill  . aspirin EC 81 MG tablet Take 81 mg by mouth daily.    . chlorthalidone (HYGROTON) 25 MG tablet Take 0.5 tablets (12.5 mg total) by mouth daily. Office visit needed 45 tablet 0  . cholecalciferol (VITAMIN D) 1000 units tablet Take 1,000 Units by mouth daily.    Marland Kitchen levothyroxine (SYNTHROID) 100 MCG tablet Take 100 mcg by mouth daily before breakfast.    . rosuvastatin (CRESTOR) 20 MG tablet TAKE 1 TABLET BY MOUTH  DAILY 90 tablet 0   No current facility-administered medications on file prior to visit.      Observations/Objective:   Vitals:   04/19/19 0808  Weight: 180 lb (81.6 kg)  Height: 5\' 7"  (1.702 m)   GEN:  The patient appears stated age and is in NAD.  Neurological examination: Patient is awake, alert, oriented to person, date, season, city. She has reduced fluency, no clear paraphasic errors today. No dysarthria. Able to follow simple commands. Remote and recent memory impaired. Able to name, difficulty with repetition. Cranial nerves: Extraocular movements intact with no nystagmus. No facial asymmetry. Motor: moves all extremities symmetrically, at least anti-gravity x 4. No incoordination on finger to nose testing. Gait: narrow-based and steady, able to tandem walk adequately. Negative Romberg test.  MMSE - Mini Mental State Exam 04/19/2019 09/10/2018  Orientation to time 3 1   Orientation to Place 2 0  Registration 3 3  Attention/ Calculation 0 1  Recall 0 0  Language- name 2 objects 2 1  Language- repeat 0 1  Language- follow 3 step command 3 3  Language- read & follow direction 0 0  Write a sentence 0 1  Copy design 0 1  Total score 13 12    Assessment and Plan:   This is a pleasant 78 yo RH woman with a history of hypertension, hyperlipidemia, with language variant of frontotemporal dementia diagnosed by Neuropsychological testing. She continues to have more speech difficulties which cause increased frustration. MMSE today 13/30 (12/30 in 08/2018). We discussed increasing Lexapro to 30mg  daily. No further syncopal episodes since 2018. Continue 24/7 care. Follow-up in 6 months, they know to call for any changes.   Follow Up Instructions:   -I discussed the assessment and treatment plan with the patient/family. The patient/family were provided an opportunity to ask questions and all were answered. The patient agreed with the plan and demonstrated an understanding of the instructions.   The patient was advised to call back or seek an in-person evaluation if the symptoms worsen or if the condition fails to improve as anticipated.     Cameron Sprang, MD

## 2019-08-07 ENCOUNTER — Other Ambulatory Visit: Payer: Self-pay

## 2019-08-07 MED ORDER — ESCITALOPRAM OXALATE 20 MG PO TABS: ORAL_TABLET | ORAL | 3 refills | Status: DC

## 2019-08-15 ENCOUNTER — Other Ambulatory Visit: Payer: Self-pay

## 2019-08-15 MED ORDER — ESCITALOPRAM OXALATE 20 MG PO TABS: ORAL_TABLET | ORAL | 3 refills | Status: DC

## 2019-08-27 ENCOUNTER — Other Ambulatory Visit: Payer: Self-pay

## 2019-08-27 MED ORDER — ROSUVASTATIN CALCIUM 20 MG PO TABS: 20.0000 mg | ORAL_TABLET | Freq: Every day | ORAL | 0 refills | Status: DC

## 2019-09-01 NOTE — Progress Notes (Signed)
Virtual Visit via Video Note   This visit type was conducted due to national recommendations for restrictions regarding the COVID-19 Pandemic (e.g. social distancing) in an effort to limit this patient's exposure and mitigate transmission in our community.  Due to her co-morbid illnesses, this patient is at least at moderate risk for complications without adequate follow up.  This format is felt to be most appropriate for this patient at this time.  All issues noted in this document were discussed and addressed.  A limited physical exam was performed with this format.  Please refer to the patient's chart for her consent to telehealth for [facility].  Evaluation Performed:  Follow-up visit  This visit type was conducted due to national recommendations for restrictions regarding the COVID-19 Pandemic (e.g. social distancing).  This format is felt to be most appropriate for this patient at this time.  All issues noted in this document were discussed and addressed.  No physical exam was performed (except for noted visual exam findings with Video Visits).  Please refer to the patient's chart (MyChart message for video visits and phone note for telephone visits) for the patient's consent to telehealth for [facility]  Date:  09/02/2019   ID:  Adrienne Jackson, DOB [birth year], MRN [id number]  Patient Location:  [address] [place] [address]   Provider location:      [facility] [address] Office [phone] Fax [phone]   PCP:  Acquanetta Belling, MD  Cardiologist:  Chilton Si, MD  Electrophysiologist:  None   Chief Complaint: Follow-up  History of Present Illness:    Adrienne Jackson is a 78 y.o. female who presents via audio/video conferencing for a telehealth visit today.  Patient verified DOB and address.  The patient does not symptoms concerning for COVID-19 infection (fever, chills, cough, or new  SHORTNESS OF BREATH).   [name] has a past medical history of essential hypertension, syncope and collapse, moderate aortic stenosis, hypertension, thyroid disease, undiagnosed cardiac murmurs, cognitive impairment, and hyperlipidemia.  She was initially seen on 07/2017 for an evaluation of near syncope and she was admitted 07/2017 for episodes of loss of consciousness.  Her episodes occurred while she was seated at the table and would last approximately 15 minutes.  Her episodes were associated with difficulty finding her words.  She underwent head CT for CVA VS cardiogenic syncope VS seizure.  Her CT, CT-A of the neck, and MRI/MRA of the brain were all negative for acute stroke.  During her first admission it was noted that her heart rate was in the 40s.  Her risk for low was discontinued.  During the following day she had a recurrent episode and she was not bradycardic at that time.  Her echocardiogram without admission showed an LVEF of 60 to 65% with normal diastolic function and mild left ventricular hypertrophy.  She was also noted to have moderate aortic stenosis with a mean gradient of 29 mmHg.  She also wore a 30-day event monitor which showed occasional PACs but was otherwise unremarkable.  She was last seen by Dr. Duke Salvia on 12/26/2017.  During that time she was doing well.  Her lisinopril was increased and her blood pressure was well controlled.  She had one episode of near syncope that occurred while she was sitting on the commode struggling to have a bowel movement.  During this event she did not pass out but became lightheaded and sweaty.  Her blood  pressure was 371 systolic and she was not bradycardic.  She denied chest pain and shortness of breath.  She continued to be very active and had no exertional symptoms, lower extremity edema, orthopnea or PND at that time.  She is seen via virtual platform today and states she feels well.  She has not had any further episodes of syncope in over  a year.  Her blood pressure is well controlled at home and ranges in the 130s over 70s.  She has moved in with her sister Adrienne Jackson.  And states that they complement each other very well.  She continues to be very active around her house.  Her sister states that they have been taking care of very well by the neighbors and by getting deliveries from local grocery store/pharmacy.  They state they are very happy to be seen virtually and prefer this visit to a an office visit especially during the COVID-19 pandemic.   She denies chest pain, shortness of breath, lower extremity edema, fatigue, palpitations, melena, hematuria, hemoptysis, diaphoresis, weakness, presyncope, syncope, orthopnea, and PND.    Prior CV studies:   The following studies were reviewed today:  EKG 12/26/2017 Normal sinus rhythm inferior infarct undetermined age, anterior infarct undetermined age 71 bpm  Echocardiogram 08/09/2018 Study Conclusions  - Left ventricle: The cavity size was normal. Wall thickness was   increased in a pattern of mild LVH. Systolic function was normal.   The estimated ejection fraction was in the range of 60% to 65%.   Wall motion was normal; there were no regional wall motion   abnormalities. Doppler parameters are consistent with abnormal   left ventricular relaxation (grade 1 diastolic dysfunction). The   E/e&' ratio is between 8-15, suggesting indeterminate LV filling   pressure. LVOT diameter (S): 18 mm. - Aortic valve: Mildly calcified leaflets. Moderate stenosis. No   significant regurgitation. Mean gradient (S): 25 mm Hg. Peak   gradient (S): 45 mm Hg. Valve area (VTI): 1.16 cm^2. Valve area   (Vmax): 1.09 cm^2. Valve area (Vmean): 1.12 cm^2. - Mitral valve: Mildly thickened leaflets . There was trivial   regurgitation. - Left atrium: The atrium was normal in size. - Tricuspid valve: There was trivial regurgitation. - Pulmonary arteries: PA peak pressure: 23 mm Hg (S) + RAP. -  Systemic veins: Not visualized.  30-day event Monitor 08/24/17:  Quality: Fair. Baseline artifact. Predominant rhythm: Sinus rhythm Average heart rate: 82 bpm Max heart rate: 124 bpm Min heart rate: 52 bpm  Occasional PACs noted  Past Medical History:  Diagnosis Date  . Essential hypertension 08/18/2017  . Hyperlipidemia   . Hypertension   . Pure hypercholesterolemia 08/18/2017  . Syncope 08/18/2017  . Thyroid disease    Past Surgical History:  Procedure Laterality Date  . ABDOMINAL HYSTERECTOMY       Current Meds  Medication Sig  . aspirin EC 81 MG tablet Take 81 mg by mouth daily.  . chlorthalidone (HYGROTON) 25 MG tablet TAKE 0.5 TABLETS BY MOUTH  DAILY.  . cholecalciferol (VITAMIN D) 1000 units tablet Take 1,000 Units by mouth daily.  Marland Kitchen escitalopram (LEXAPRO) 20 MG tablet Take 1.5 tablets daily  . levothyroxine (SYNTHROID) 100 MCG tablet Take 100 mcg by mouth daily before breakfast.  . lisinopril (ZESTRIL) 20 MG tablet Take 20 mg by mouth daily.  Loma [place] (OYSTER CALCIUM) 500 MG TABS tablet Take 500 mg of elemental calcium by mouth 3 (three) times daily.  . rosuvastatin (CRESTOR) 20 MG tablet  Take 1 tablet (20 mg total) by mouth daily. NEEDS OV     Allergies:   Patient has no known allergies.   Social History   Tobacco Use  . Smoking status: Never Smoker  . Smokeless tobacco: Never Used  Substance Use Topics  . Alcohol use: No    Frequency: Never  . Drug use: No     Family Hx: The patient's family history includes Heart disease in her father.  ROS:   Please see the history of present illness.     All other systems reviewed and are negative.   Labs/Other Tests and Data Reviewed:    Recent Labs: No results found for requested labs within last 8760 hours.   Recent Lipid Panel Lab Results  Component Value Date/Time   CHOL 128 08/18/2017 02:47 AM   TRIG 52 08/18/2017 02:47 AM   HDL 69 08/18/2017 02:47 AM   CHOLHDL 1.9 08/18/2017 02:47 AM    LDLCALC 49 08/18/2017 02:47 AM    Wt Readings from Last 3 Encounters:  04/19/19 180 lb (81.6 kg)  09/10/18 183 lb (83 kg)  12/26/17 172 lb (78 kg)     Exam:    Vital Signs:  BP 131/90   Jackson 86    Well nourished, well developed female in no  acute distress.   ASSESSMENT & PLAN:    1.  Syncope-no further episodes.  Thought previously to be related to bradycardia.  Beta-blocker has been stopped.  No recent episodes of bradycardia. Continue to monitor  Moderate aortic stenosis-echocardiogram 07/2018 showed a mean gradient of 25 mmHg.  She continues to be very active and has no increased work of breathing or fatigue. Repeat echocardiogram  Essential hypertension-BP today 131/90.  Well-controlled at home averages 130 over 70s, taken morning and night Continue lisinopril 20 mg tablet daily Heart healthy low-sodium diet-salty 6 Increase physical activity as tolerated  Hyperlipidemia-LDL 49 07/2017 Continue rosuvastatin 20 mg tablet daily Heart healthy low-sodium high-fiber diet Increase physical activity as tolerated Followed by PCP-obtain lipid panel from PCP   COVID-19 Education: The signs and symptoms of COVID-19 were discussed with the patient and how to seek care for testing (follow up with PCP or arrange E-visit).  The importance of social distancing was discussed today.  Patient Risk:   After full review of this patients clinical status, I feel that they are at least moderate risk at this time.  Time:   Today, I have spent 15 minutes with the patient with telehealth technology discussing syncope, hypertension, COVID-19 pandemic, new PCP and  lab work.     Medication Adjustments/Labs and Tests Ordered: Current medicines are reviewed at length with the patient today.  Concerns regarding medicines are outlined above.   Tests Ordered: No orders of the defined types were placed in this encounter.  Medication Changes: No orders of the defined types were placed in  this encounter.   Disposition:  Disposition: Follow-up with Dr. Duke Salviaandolph in 12 months    Thomasene RippleJesse M. Girard Koontz NP-C       [facility] [address] Office [phone] Fax [phone]

## 2019-09-02 ENCOUNTER — Telehealth (INDEPENDENT_AMBULATORY_CARE_PROVIDER_SITE_OTHER): Payer: Medicare Other | Admitting: General Practice

## 2019-09-02 VITALS — BP 131/90 | HR 86

## 2019-09-02 DIAGNOSIS — R55 Syncope and collapse: Secondary | ICD-10-CM

## 2019-09-02 DIAGNOSIS — I10 Essential (primary) hypertension: Secondary | ICD-10-CM

## 2019-09-02 DIAGNOSIS — I350 Nonrheumatic aortic (valve) stenosis: Secondary | ICD-10-CM

## 2019-09-02 DIAGNOSIS — E7800 Pure hypercholesterolemia, unspecified: Secondary | ICD-10-CM

## 2019-09-02 NOTE — Patient Instructions (Addendum)
Medication Instructions:  The current medical regimen is effective;  continue present plan and medications as directed. Please refer to the Current Medication list given to you today. If you need a refill on your cardiac medications before your next appointment, please call your pharmacy.  Testing/Procedures: Echocardiogram - Your physician has requested that you have an echocardiogram. Echocardiography is a painless test that uses sound waves to create images of your heart. It provides your doctor with information about the size and shape of your heart and how well your heart's chambers and valves are working. This procedure takes approximately one hour. There are no restrictions for this procedure. This will be performed at our [facility] location - [address], [address].  Follow-Up: IN 12 months Please call our office 2 months in advance, OCT 2021 to schedule this [place] 2021 appointment. In Person You may see Skeet Latch, MD or one of the following Advanced Practice Providers on your designated Care Team:  Coletta Memos, FNP, [address], PA-C, [place], [place].    At [facility], you and your health needs are our priority.  As part of our continuing mission to provide you with exceptional heart care, we have created designated Provider Care Teams.  These Care Teams include your primary Cardiologist (physician) and Advanced Practice Providers (APPs -  Physician Assistants and Nurse Practitioners) who all work together to provide you with the care you need, when you need it.  Thank you for choosing [facility] at [facility]!!             Happy Holidays!!

## 2019-09-03 ENCOUNTER — Telehealth: Payer: Self-pay | Admitting: General Practice

## 2019-09-03 NOTE — Telephone Encounter (Signed)
Patient's daughter is calling requesting she attend patient's Echo scheduled for 09/13/19 if possible due to patient having verbal dementia not being able to understand instructions very well. Please Advise.

## 2019-09-03 NOTE — Telephone Encounter (Signed)
Contacted Adrienne Jackson at [employer] lab at [facility]. Informed her of info below. She states it is okay for pt daughter to accompany pt for echo 12/18 and she will include this in pt appt notes.  DPR on file. Attempted call to pt daughter, Adrienne Jackson. Left detailed message regarding approval to accompany pt for 12/18 echo appt at [place]

## 2019-09-04 MED ORDER — ESCITALOPRAM OXALATE 20 MG PO TABS: ORAL_TABLET | ORAL | 3 refills | Status: DC

## 2019-09-13 ENCOUNTER — Other Ambulatory Visit: Payer: Self-pay

## 2019-09-13 ENCOUNTER — Ambulatory Visit (HOSPITAL_COMMUNITY): Payer: Medicare Other | Attending: Cardiology

## 2019-09-13 DIAGNOSIS — I350 Nonrheumatic aortic (valve) stenosis: Secondary | ICD-10-CM | POA: Diagnosis not present

## 2019-09-30 ENCOUNTER — Other Ambulatory Visit: Payer: Self-pay

## 2019-09-30 MED ORDER — ROSUVASTATIN CALCIUM 20 MG PO TABS: 20.0000 mg | ORAL_TABLET | Freq: Every day | ORAL | 3 refills | Status: DC

## 2019-09-30 MED ORDER — ESCITALOPRAM OXALATE 20 MG PO TABS: ORAL_TABLET | ORAL | 3 refills | Status: DC

## 2019-09-30 MED ORDER — CHLORTHALIDONE 25 MG PO TABS: ORAL_TABLET | ORAL | 3 refills | Status: DC

## 2019-09-30 MED ORDER — LISINOPRIL 20 MG PO TABS: 20.0000 mg | ORAL_TABLET | Freq: Every day | ORAL | 3 refills | Status: DC

## 2019-10-22 ENCOUNTER — Telehealth: Payer: Self-pay

## 2019-10-22 ENCOUNTER — Ambulatory Visit: Payer: Medicare Other

## 2019-10-22 NOTE — Telephone Encounter (Signed)
[employer] denied Escitalopram 20mg  1.5 tab qd due to quantity limit.   PA initiated through Covermymeds today.  Pts daughter made aware. She states that pt has enough medication for a few weeks.

## 2019-10-23 ENCOUNTER — Telehealth: Payer: Self-pay

## 2019-10-23 ENCOUNTER — Encounter: Payer: Self-pay | Admitting: *Deleted

## 2019-10-23 NOTE — Progress Notes (Addendum)
Received a fax from [facility] needing 1 question answered so I answered it by calling [phone] and Devonne said The question which was incomplete wasQ4  "Please provide diagnosis and ICD 10" Only ICD10 was listed so I verbally relayed the diagnosis. Devonne thanked me and said they will get back to [place] soon. It was again denied due to ICD 10 code not on label for this med . Dr. Karel Jarvis said the ICD10 code to use is F32.9 Depression so I will appeal again with correct code.

## 2019-10-23 NOTE — Telephone Encounter (Signed)
No response from Covermymeds at this time.  Drucie Opitz PA CASE ID- [id number]

## 2019-10-24 NOTE — Telephone Encounter (Signed)
Pt daughter called informed the next step is an appeal for her moms medication

## 2019-10-24 NOTE — Telephone Encounter (Signed)
Patient's daughter returned call re: denied Lexapro 20 MG. What is the next step?

## 2019-10-31 ENCOUNTER — Ambulatory Visit: Payer: Medicare PPO | Attending: Internal Medicine

## 2019-10-31 DIAGNOSIS — Z23 Encounter for immunization: Secondary | ICD-10-CM

## 2019-10-31 NOTE — Progress Notes (Signed)
   Covid-19 Vaccination Clinic  Name:  Adrienne Jackson    MRN: [id number] DOB: 06/14/1941  10/31/2019  Adrienne Jackson was observed post Covid-19 immunization for 15 minutes without incidence. She was provided with Vaccine Information Sheet and instruction to access the V-Safe system.   Adrienne Jackson was instructed to call 911 with any severe reactions post vaccine: Marland Kitchen Difficulty breathing  . Swelling of your face and throat  . A fast heartbeat  . A bad rash all over your body  . Dizziness and weakness    Immunizations Administered    Name Date Dose VIS Date Route   Pfizer COVID-19 Vaccine 10/31/2019 12:13 PM 0.3 mL 09/06/2019 Intramuscular   Manufacturer: [employer], [employer]   Lot: [id number]   NDC: [address]-4

## 2019-11-07 NOTE — Progress Notes (Signed)
PA approved 09/27/19 until 09/25/20  Member ID [id number] reference number [id number]

## 2019-11-25 ENCOUNTER — Ambulatory Visit: Payer: Medicare PPO | Attending: Internal Medicine

## 2019-11-25 DIAGNOSIS — Z23 Encounter for immunization: Secondary | ICD-10-CM

## 2019-11-25 NOTE — Progress Notes (Signed)
   Covid-19 Vaccination Clinic  Name:  Adrienne Jackson    MRN: [id number] DOB: [birth year]  11/25/2019  [name] was observed post Covid-19 immunization for 30 minutes based on pre-vaccination screening without incidence. She was provided with Vaccine Information Sheet and instruction to access the V-Safe system.   [name] was instructed to call 911 with any severe reactions post vaccine: Marland Kitchen Difficulty breathing  . Swelling of your face and throat  . A fast heartbeat  . A bad rash all over your body  . Dizziness and weakness    Immunizations Administered    Name Date Dose VIS Date Route   Pfizer COVID-19 Vaccine 11/25/2019  3:17 PM 0.3 mL 09/06/2019 Intramuscular   Manufacturer: [employer], [employer]   Lot: [id number]   NDC: [address]-8

## 2019-12-10 ENCOUNTER — Other Ambulatory Visit: Payer: Self-pay

## 2019-12-10 ENCOUNTER — Encounter: Payer: Self-pay | Admitting: Neurology

## 2019-12-10 ENCOUNTER — Ambulatory Visit (INDEPENDENT_AMBULATORY_CARE_PROVIDER_SITE_OTHER): Payer: Medicare PPO | Admitting: Neurology

## 2019-12-10 VITALS — BP 141/63 | HR 67 | Ht 67.0 in | Wt 192.2 lb

## 2019-12-10 DIAGNOSIS — F0280 Dementia in other diseases classified elsewhere without behavioral disturbance: Secondary | ICD-10-CM | POA: Diagnosis not present

## 2019-12-10 DIAGNOSIS — F32A Depression, unspecified: Secondary | ICD-10-CM

## 2019-12-10 DIAGNOSIS — F329 Major depressive disorder, single episode, unspecified: Secondary | ICD-10-CM | POA: Diagnosis not present

## 2019-12-10 DIAGNOSIS — G3109 Other frontotemporal dementia: Secondary | ICD-10-CM

## 2019-12-10 NOTE — Patient Instructions (Signed)
Great seeing you! Continue Lexapro 20mg: take 1.5 tablets daily ° Follow-up in 6 months, call for any changes ° ° °FALL PRECAUTIONS: Be cautious when walking. Scan the area for obstacles that may increase the risk of trips and falls. When getting up in the mornings, sit up at the edge of the bed for a few minutes before getting out of bed. Consider elevating the bed at the head end to avoid drop of blood pressure when getting up. Walk always in a well-lit room (use night lights in the walls). Avoid area rugs or power cords from appliances in the middle of the walkways. Use a walker or a cane if necessary and consider physical therapy for balance exercise. Get your eyesight checked regularly. ° °HOME SAFETY: Consider the safety of the kitchen when operating appliances like stoves, microwave oven, and blender. Consider having supervision and share cooking responsibilities until no longer able to participate in those. Accidents with firearms and other hazards in the house should be identified and addressed as well. ° ° °ABILITY TO BE LEFT ALONE: If patient is unable to contact 911 operator, consider using LifeLine, or when the need is there, arrange for someone to stay with patients. Smoking is a fire hazard, consider supervision or cessation. Risk of wandering should be assessed by caregiver and if detected at any point, supervision and safe proof recommendations should be instituted. ° ° °RECOMMENDATIONS FOR ALL PATIENTS WITH MEMORY PROBLEMS: °1. Continue to exercise (Recommend 30 minutes of walking everyday, or 3 hours every week) °2. Increase social interactions - continue going to Church and enjoy social gatherings with friends and family °3. Eat healthy, avoid fried foods and eat more fruits and vegetables °4. Maintain adequate blood pressure, blood sugar, and blood cholesterol level. Reducing the risk of stroke and cardiovascular disease also helps promoting better memory. °5. Avoid stressful situations. Live a  simple life and avoid aggravations. Organize your time and prepare for the next day in anticipation. °6. Sleep well, avoid any interruptions of sleep and avoid any distractions in the bedroom that may interfere with adequate sleep quality °7. Avoid sugar, avoid sweets as there is a strong link between excessive sugar intake, diabetes, and cognitive impairment °The Mediterranean diet has been shown to help patients reduce the risk of progressive memory disorders and reduces cardiovascular risk. This includes eating fish, eat fruits and green leafy vegetables, nuts like almonds and hazelnuts, walnuts, and also use olive oil. Avoid fast foods and fried foods as much as possible. Avoid sweets and sugar as sugar use has been linked to worsening of memory function. ° °There is always a concern of gradual progression of memory problems. If this is the case, then we may need to adjust level of care according to patient needs. Support, both to the patient and caregiver, should then be put into place. ° °

## 2019-12-10 NOTE — Progress Notes (Signed)
NEUROLOGY FOLLOW UP OFFICE NOTE  Adrienne Jackson [id number] 17-Jun-1941  HISTORY OF PRESENT ILLNESS: I had the pleasure of seeing Adrienne Jackson in follow-up in the neurology clinic on 12/10/2019.  The patient was last seen 7 months ago for frontotemporal dementia. She is again accompanied by her daughter Misty Stanley who helps supplement the history today.  Records and images were personally reviewed where available.  MMSE 13/30 in August 2020. Since her last visit, Misty Stanley reports continued decrease in verbal functioning, loss of choice of words. When they do Facetime, her sister does most of the talking and she would be listening and laughing beside her. She is noted to have more word-finding difficulties today with reduced fluency. She is able to answer one-word answers but unable to elaborate with sentences. She denies any headaches, dizziness, vision changes, focal numbness/tingling/weakness, no falls or tremors. Sleep is good. Mood is good, better with increase in Lexapro to 30mg  daily, no side effects. No hallucinations or paranoia. She is able to bathe and dress independently. She does not drive. Her sister helps with medications, her daughter manages finances.    History of Present Illness:  The patient was seen as a virtual video visit on 05/01/2019. She was last seen 8 months ago for frontotemporal dementia. Her daughter is present to provide additional information, her sister was also present. MMSE 12/30 in 08/2018. She lives with her sister who manages her medications. Her daughter manages finances. She feels she is doing okay, her daughter has noticed more decline with language since her last visit. She is independent with dressing and bathing. She does not drive. She can get a little irritated with her sister at times, but this does not last long. No paranoia or hallucinations. She is on Lexapro 20mg  daily without side effects. Sleep is good. No wandering behavior. No paranoia or  hallucinations. No further syncopal episodes since 2018. She denies any headaches, dizziness, vision changes, no significant falls.  History on Initial Assessment 09/10/2018: This is a pleasant 79 year old right-handed woman with a history of hypertension, hyperlipidemia, hypothyroidism, presenting to establish care for dementia. Records from [place], 79 were reviewed. Her daughter reports that the diagnosis of frontotemporal dementia came about when she started having episodes of passing out and slumping over in May 2018. She initially saw neurologist Dr. [place] and had a normal 81-hour EEG. MRI brain showed global atrophic changes. It was also felt she had underlying dementia at that point. She had 2 episodes in one day last Thanksgiving 2018 where she had a CTA head and neck and another MRI brain which were unremarkable. She was started on Keppra for presumed seizures. She was evaluated by epileptologist Dr. Glenda Chroman and had inpatient vEEG monitoring which showed left hemisphere slowing, particularly involving the frontotemporal parietal region, no epileptiform discharges. MRI in April 2018 showed generalized atrophic changes significantly worse across left hemisphere. It was felt that EEG did not support diagnosis of epilepsy, and that syncopal events were related to BP fluctuations. Keppra was discontinued and Depakote started for mood, anxiety, and sleep. She underwent Neuropsychological testing in April 2019 with findings consistent with a likely diagnosis of the language variant of frontotemporal dementia.   Her daughter reports that she has not had any further syncopal episodes since BP medications were adjusted in November 2018. Over the past year, she has noticed more cognitive decline, needing to repeat instructions, difficulty with multi-step commands. She would get the wrong thing from the fridge. She was making fish last  79 Christmas and could not understand the recipe card. She has more word-finding  difficulties, causing more anxiety due to difficulty communicating. She is on Lexapro 10mg  daily without side effects. She stopped driving in May 79 after driving evaluation recommended no further driving. She moved in with her sister living in [place] who manages her medications. She is independent with dressing and bathing. No paranoia or hallucinations. She denies any headaches, dizziness, vision changes, focal numbness/tingling/weakness, neck/back pain, bowel/bladder dysfunction, anosmia, or tremors. No further staring/unresponsive episodes per daughter. She denies any olfactory/gustatory hallucinations, myoclonic jerks. No family history of dementia, no history of concussions. She drinks alcohol socially.     PAST MEDICAL HISTORY: Past Medical History:  Diagnosis Date  . Essential hypertension 08/18/2017  . Hyperlipidemia   . Hypertension   . Pure hypercholesterolemia 08/18/2017  . Syncope 08/18/2017  . Thyroid disease     MEDICATIONS: Current Outpatient Medications on File Prior to Visit  Medication Sig Dispense Refill  . aspirin EC 81 MG tablet Take 81 mg by mouth daily.    . chlorthalidone (HYGROTON) 25 MG tablet TAKE 0.5 TABLETS BY MOUTH  DAILY. 45 tablet 3  . cholecalciferol (VITAMIN D) 1000 units tablet Take 1,000 Units by mouth daily.    Marland Kitchen escitalopram (LEXAPRO) 20 MG tablet Take 1.5 tablets daily 135 tablet 3  . levothyroxine (SYNTHROID) 100 MCG tablet Take 100 mcg by mouth daily before breakfast.    . lisinopril (ZESTRIL) 20 MG tablet Take 1 tablet (20 mg total) by mouth daily. 90 tablet 3  . Oyster Shell (OYSTER CALCIUM) 500 MG TABS tablet Take 500 mg of elemental calcium by mouth 3 (three) times daily.    . rosuvastatin (CRESTOR) 20 MG tablet Take 1 tablet (20 mg total) by mouth daily. 90 tablet 3   No current facility-administered medications on file prior to visit.    ALLERGIES: No Known Allergies  FAMILY HISTORY: Family History  Problem Relation Age of  Onset  . Heart disease Father     SOCIAL HISTORY: Social History   Socioeconomic History  . Marital status: Single    Spouse name: Not on file  . Number of children: Not on file  . Years of education: Not on file  . Highest education level: Not on file  Occupational History  . Not on file  Tobacco Use  . Smoking status: Never Smoker  . Smokeless tobacco: Never Used  Substance and Sexual Activity  . Alcohol use: No  . Drug use: No  . Sexual activity: Not Currently  Other Topics Concern  . Not on file  Social History Narrative   Lives with sister in a two story home      Right handed      Highest level of edu- 12th grade   Social Determinants of Health   Financial Resource Strain:   . Difficulty of Paying Living Expenses:   Food Insecurity:   . Worried About Charity fundraiser in the Last Year:   . Arboriculturist in the Last Year:   Transportation Needs:   . Film/video editor (Medical):   Marland Kitchen Lack of Transportation (Non-Medical):   Physical Activity:   . Days of Exercise per Week:   . Minutes of Exercise per Session:   Stress:   . Feeling of Stress :   Social Connections:   . Frequency of Communication with Friends and Family:   . Frequency of Social Gatherings with Friends and Family:   . Attends  Religious Services:   . Active Member of Clubs or Organizations:   . Attends Banker Meetings:   Marland Kitchen Marital Status:   Intimate Partner Violence:   . Fear of Current or Ex-Partner:   . Emotionally Abused:   Marland Kitchen Physically Abused:   . Sexually Abused:     REVIEW OF SYSTEMS: Constitutional: No fevers, chills, or sweats, no generalized fatigue, change in appetite Eyes: No visual changes, double vision, eye pain Ear, nose and throat: No hearing loss, ear pain, nasal congestion, sore throat Cardiovascular: No chest pain, palpitations Respiratory:  No shortness of breath at rest or with exertion, wheezes GastrointestinaI: No nausea, vomiting, diarrhea,  abdominal pain, fecal incontinence Genitourinary:  No dysuria, urinary retention or frequency Musculoskeletal:  No neck pain, back pain Integumentary: No rash, pruritus, skin lesions Neurological: as above Psychiatric: No depression, insomnia, anxiety Endocrine: No palpitations, fatigue, diaphoresis, mood swings, change in appetite, change in weight, increased thirst Hematologic/Lymphatic:  No anemia, purpura, petechiae. Allergic/Immunologic: no itchy/runny eyes, nasal congestion, recent allergic reactions, rashes  PHYSICAL EXAM: Vitals:   12/10/19 0838  BP: (!) 141/63  Pulse: 67  SpO2: 96%   General: No acute distress Head:  Normocephalic/atraumatic Skin/Extremities: No rash, no edema Neurological Exam: alert and oriented to person, month. She has decreased fluency and more word-finding difficulties today. She has echolalia. No dysarthria. Fund of knowledge is appropriate.  Recent and remote memory are impaired. Attention and concentration are reduced. Difficulty with naming and repetition.  MMSE - Mini Mental State Exam 12/10/2019 04/19/2019 09/10/2018  Orientation to time 1 3 1   Orientation to Place 0 2 0  Registration 3 3 3   Attention/ Calculation 0 0 1  Recall 0 0 0  Language- name 2 objects 0 2 1  Language- repeat 0 0 1  Language- follow 3 step command 3 3 3   Language- read & follow direction 1 0 0  Write a sentence 0 0 1  Copy design 1 0 1  Total score 9 13 12    Cranial nerves: Pupils equal, round, reactive to light. Extraocular movements intact with no nystagmus. Visual fields full. No facial asymmetry. Motor: Bulk and tone normal, no cogwheeling. Muscle strength 5/5 throughout with no pronator drift. Finger to nose testing intact.  Gait narrow-based and steady, no ataxia. No tremor  IMPRESSION: This is a pleasant 79 yo RH woman with a history of hypertension, hyperlipidemia, with language variant of frontotemporal dementia diagnosed by Neuropsychological testing. Language  difficulties progress, she has more word-finding difficulties and reduced fluency today. We again discussed prognosis with FTD language variant, continue supportive care. Lexapro 30mg  daily has helped with mood issues/irritability. No further syncopal episodes since 2018. Continue 24/7 care. Follow-up in 6 months, they know to call for any changes.   Thank you for allowing me to participate in her care.  Please do not hesitate to call for any questions or concerns.   , M.D.   CC: Dr. 

## 2020-07-02 DIAGNOSIS — I739 Peripheral vascular disease, unspecified: Secondary | ICD-10-CM | POA: Diagnosis not present

## 2020-07-02 DIAGNOSIS — B351 Tinea unguium: Secondary | ICD-10-CM | POA: Diagnosis not present

## 2020-07-08 DIAGNOSIS — M810 Age-related osteoporosis without current pathological fracture: Secondary | ICD-10-CM | POA: Diagnosis not present

## 2020-07-08 DIAGNOSIS — G3109 Other frontotemporal dementia: Secondary | ICD-10-CM | POA: Diagnosis not present

## 2020-07-08 DIAGNOSIS — I10 Essential (primary) hypertension: Secondary | ICD-10-CM | POA: Diagnosis not present

## 2020-07-08 DIAGNOSIS — E039 Hypothyroidism, unspecified: Secondary | ICD-10-CM | POA: Diagnosis not present

## 2020-07-08 DIAGNOSIS — E7800 Pure hypercholesterolemia, unspecified: Secondary | ICD-10-CM | POA: Diagnosis not present

## 2020-07-14 ENCOUNTER — Other Ambulatory Visit: Payer: Self-pay

## 2020-07-14 ENCOUNTER — Encounter: Payer: Self-pay | Admitting: Neurology

## 2020-07-14 ENCOUNTER — Telehealth (INDEPENDENT_AMBULATORY_CARE_PROVIDER_SITE_OTHER): Payer: Medicare PPO | Admitting: Neurology

## 2020-07-14 VITALS — Ht 67.0 in | Wt 207.0 lb

## 2020-07-14 DIAGNOSIS — G3109 Other frontotemporal dementia: Secondary | ICD-10-CM

## 2020-07-14 DIAGNOSIS — M810 Age-related osteoporosis without current pathological fracture: Secondary | ICD-10-CM | POA: Diagnosis not present

## 2020-07-14 DIAGNOSIS — E039 Hypothyroidism, unspecified: Secondary | ICD-10-CM | POA: Diagnosis not present

## 2020-07-14 DIAGNOSIS — Z6829 Body mass index (BMI) 29.0-29.9, adult: Secondary | ICD-10-CM | POA: Diagnosis not present

## 2020-07-14 DIAGNOSIS — F32A Depression, unspecified: Secondary | ICD-10-CM

## 2020-07-14 DIAGNOSIS — F0280 Dementia in other diseases classified elsewhere without behavioral disturbance: Secondary | ICD-10-CM

## 2020-07-14 MED ORDER — ESCITALOPRAM OXALATE 20 MG PO TABS: ORAL_TABLET | ORAL | 3 refills | Status: DC

## 2020-07-14 NOTE — Progress Notes (Signed)
Virtual Visit via Video Note The purpose of this virtual visit is to provide medical care while limiting exposure to the novel coronavirus.    Consent was obtained for video visit:  Yes.   Answered questions that patient had about telehealth interaction:  Yes.   I discussed the limitations, risks, security and privacy concerns of performing an evaluation and management service by telemedicine. I also discussed with the patient that there may be a patient responsible charge related to this service. The patient expressed understanding and agreed to proceed.  Pt location: Home Physician Location: office Name of referring provider:  Juluis Rainier, MD I connected with Adrienne Jackson at patients initiation/request on 07/14/2020 at  8:30 AM EDT by video enabled telemedicine application and verified that I am speaking with the correct person using two identifiers. Pt MRN:  [id number] Pt DOB:  1941-09-18 Video Participants:  Adrienne Jackson;  Juanetta Gosling (daughter)   History of Present Illness:  The patient was seen as a virtual visit on 07/14/2020. She was last seen 7 months ago for frontotemporal dementia. She is again accompanied by her daughter Misty Stanley who helps supplement the history today. Since her last visit, Misty Stanley reports progressive decline in communication/verbalization. She is able to do things physically such as make her own coffee, however when making meals, her sister has to give her instructions and she would get something else when asked to get something from the fridge. She is independent with dressing and bathing but does not bathe as regularly or thoroughly as she should. She has not been washing her hair, she bathes in the upstairs bathroom and her shampoo is not empty, her sister cannot climb stairs and recently encouraged her to use the downstairs bathroom so she can supervise more. Her sister manages medications, no missed medications. There is occasional agitation, more  when she is unable to get her words out and family asks her about it, increasing her frustration. She is unable to use the phone, even when [place] put pictures instead of numbers, she is unable to match Lisa's face to what she wants to do. They feel sleep is okay, no wandering behavior. She is sleeping in her recliner more. The bedsheets are not getting washed, Misty Stanley is not sure if she is forgetting or she is not sleeping on it. No paranoia or hallucinations. She denies any headaches, dizziness, focal numbness/tingling/weakness, no falls.    History on Initial Assessment 09/10/2018: This is a pleasant 79 year old right-handed woman with a history of hypertension, hyperlipidemia, hypothyroidism, presenting to establish care for dementia. Records from [place], [place] were reviewed. Her daughter reports that the diagnosis of frontotemporal dementia came about when she started having episodes of passing out and slumping over in May 2018. She initially saw neurologist Dr. Glenda Chroman and had a normal 72-hour EEG. MRI brain showed global atrophic changes. It was also felt she had underlying dementia at that point. She had 2 episodes in one day last Thanksgiving 2018 where she had a CTA head and neck and another MRI brain which were unremarkable. She was started on Keppra for presumed seizures. She was evaluated by epileptologist Dr. Jobe Gibbon and had inpatient vEEG monitoring which showed left hemisphere slowing, particularly involving the frontotemporal parietal region, no epileptiform discharges. MRI in April 2018 showed generalized atrophic changes significantly worse across left hemisphere. It was felt that EEG did not support diagnosis of epilepsy, and that syncopal events were related to BP fluctuations. Keppra was discontinued and Depakote started for  mood, anxiety, and sleep. She underwent Neuropsychological testing in April 2019 with findings consistent with a likely diagnosis of the language variant of frontotemporal  dementia.   Her daughter reports that she has not had any further syncopal episodes since BP medications were adjusted in November 2018. Over the past year, she has noticed more cognitive decline, needing to repeat instructions, difficulty with multi-step commands. She would get the wrong thing from the fridge. She was making fish last Christmas and could not understand the recipe card. She has more word-finding difficulties, causing more anxiety due to difficulty communicating. She is on Lexapro 10mg  daily without side effects. She stopped driving in May 2019 after driving evaluation recommended no further driving. She moved in with her sister living in [place] who manages her medications. She is independent with dressing and bathing. No paranoia or hallucinations. She denies any headaches, dizziness, vision changes, focal numbness/tingling/weakness, neck/back pain, bowel/bladder dysfunction, anosmia, or tremors. No further staring/unresponsive episodes per daughter. She denies any olfactory/gustatory hallucinations, myoclonic jerks. No family history of dementia, no history of concussions. She drinks alcohol socially.      Current Outpatient Medications on File Prior to Visit  Medication Sig Dispense Refill  . chlorthalidone (HYGROTON) 25 MG tablet TAKE 0.5 TABLETS BY MOUTH  DAILY. 45 tablet 3  . cholecalciferol (VITAMIN D) 1000 units tablet Take 1,000 Units by mouth daily.    [place] escitalopram (LEXAPRO) 20 MG tablet Take 1.5 tablets daily 135 tablet 3  . levothyroxine (SYNTHROID) 100 MCG tablet Take 100 mcg by mouth daily before breakfast.    . lisinopril (ZESTRIL) 20 MG tablet Take 1 tablet (20 mg total) by mouth daily. 90 tablet 3  . Oyster Shell (OYSTER CALCIUM) 500 MG TABS tablet Take 500 mg of elemental calcium by mouth 2 (two) times daily.     . rosuvastatin (CRESTOR) 20 MG tablet Take 1 tablet (20 mg total) by mouth daily. 90 tablet 3   No current facility-administered medications on file  prior to visit.     Observations/Objective:   Vitals:   07/14/20 0818  Weight: 207 lb (93.9 kg)  Height: 5\' 7"  (1.702 m)   GEN:  The patient appears stated age and is in NAD.  Neurological examination: Patient is awake, alert, oriented to person. Unable to say month ("[birth year], [birth year]") or year. Unable to say address. She is able to name cup, pen. Difficulty with repetition. Reduced fluency. When asked to draw clock, she wrote the letters C and B. Cranial nerves: Extraocular movements intact with no nystagmus. No facial asymmetry. Motor: moves all extremities symmetrically, at least anti-gravity x 4. No incoordination on finger to nose testing.    Assessment and Plan:   This is a pleasant 79 yo RH woman with a history of hypertension, hyperlipidemia, with language variant of frontotemporal dementia diagnosed by Neuropsychological testing. She is having increasing expressive aphasia, as well as some difficulty following instructions. Continue Lexapro 30mg  daily, family feels behaviors overall manageable on current medication. We discussed home safety, hygiene concerns, and having increased help at home. will start looking into this. No further syncopal episodes since 2018. Continue 24/7 care. Follow-up in 6-8 months, they know to call for any changes.    Follow Up Instructions:   -I discussed the assessment and treatment plan with the patient. The patient was provided an opportunity to ask questions and all were answered. The patient agreed with the plan and demonstrated an understanding of the instructions.   The patient was  advised to call back or seek an in-person evaluation if the symptoms worsen or if the condition fails to improve as anticipated.     Van Clines, MD

## 2020-07-20 DIAGNOSIS — M810 Age-related osteoporosis without current pathological fracture: Secondary | ICD-10-CM | POA: Diagnosis not present

## 2020-09-11 ENCOUNTER — Telehealth: Payer: Self-pay | Admitting: *Deleted

## 2020-09-11 NOTE — Telephone Encounter (Signed)
Tried to call patient regarding telephone visit Monday Dr Pease needs to change to video/mychart or in office visit Left message to call back  

## 2020-09-14 ENCOUNTER — Encounter: Payer: Self-pay | Admitting: Cardiovascular Disease

## 2020-09-14 ENCOUNTER — Telehealth (INDEPENDENT_AMBULATORY_CARE_PROVIDER_SITE_OTHER): Payer: Medicare PPO | Admitting: Cardiovascular Disease

## 2020-09-14 ENCOUNTER — Telehealth: Payer: Self-pay | Admitting: Cardiovascular Disease

## 2020-09-14 VITALS — BP 139/77 | HR 57 | Temp 97.9°F | Ht 68.0 in

## 2020-09-14 DIAGNOSIS — E7800 Pure hypercholesterolemia, unspecified: Secondary | ICD-10-CM

## 2020-09-14 DIAGNOSIS — I10 Essential (primary) hypertension: Secondary | ICD-10-CM | POA: Diagnosis not present

## 2020-09-14 DIAGNOSIS — Z5181 Encounter for therapeutic drug level monitoring: Secondary | ICD-10-CM

## 2020-09-14 DIAGNOSIS — I350 Nonrheumatic aortic (valve) stenosis: Secondary | ICD-10-CM | POA: Diagnosis not present

## 2020-09-14 MED ORDER — CHLORTHALIDONE 25 MG PO TABS: ORAL_TABLET | ORAL | 3 refills | Status: DC

## 2020-09-14 NOTE — Telephone Encounter (Signed)
Left message to call back to discuss AVS and schedule follow up visit

## 2020-09-14 NOTE — Telephone Encounter (Signed)
Follow up   Adrienne Jackson is returning a phone call to [name] says the Video visit will work and to call [phone]

## 2020-09-14 NOTE — Telephone Encounter (Signed)
Pt c/o medication issue:  1. Name of Medication: chlorthalidone (HYGROTON) 25 MG tablet  2. How are you currently taking this medication (dosage and times per day)? 1 tablet in the morning with food  3. Are you having a reaction (difficulty breathing--STAT)? No   4. What is your medication issue? Patient's sister, Corrie Dandy, is following up. I informed her that the modified Rx has been sent to the pharmacy, [facility]. She was appreciative. However, she states she would like to have it transferred to [facility]. She states during her initial call, she forgot the patient switched pharmacies and she named the incorrect location. Are we able to have Rx sent to [facility] instead of [facility]? Please advise.  [facility] - [place], [place] - [address]

## 2020-09-14 NOTE — Telephone Encounter (Signed)
Sent prescription to [facility] as requested

## 2020-09-14 NOTE — Patient Instructions (Addendum)
Medication Instructions:  INCREASE YOUR CHLORTHALIDONE TO 25 MG DAILY   *If you need a refill on your cardiac medications before your next appointment, please call your pharmacy*  Lab Work: BMET IN 1 WEEK   If you have labs (blood work) drawn today and your tests are completely normal, you will receive your results only by: Marland Kitchen MyChart Message (if you have MyChart) OR . A paper copy in the mail If you have any lab test that is abnormal or we need to change your treatment, we will call you to review the results.  Testing/Procedures: NONE  Follow-Up: At [facility], you and your health needs are our priority.  As part of our continuing mission to provide you with exceptional heart care, we have created designated Provider Care Teams.  These Care Teams include your primary Cardiologist (physician) and Advanced Practice Providers (APPs -  Physician Assistants and Nurse Practitioners) who all work together to provide you with the care you need, when you need it.  We recommend signing up for the patient portal called "MyChart".  Sign up information is provided on this After Visit Summary.  MyChart is used to connect with patients for Virtual Visits (Telemedicine).  Patients are able to view lab/test results, encounter notes, upcoming appointments, etc.  Non-urgent messages can be sent to your provider as well.   To learn more about what you can do with MyChart, go to [email or web address].    Your next appointment:   JESSE C NP 2 MONTHS   DR [place] 1 YEAR  You will receive a reminder letter in the mail two months in advance. If you don't receive a letter, please call our office to schedule the follow-up appointment.

## 2020-09-14 NOTE — Telephone Encounter (Signed)
Patient's sister, Adrienne Jackson, states since Dr. Duke Salvia advised for the patient to increase her Chlorthalidone from 12.5 mg to 25 mg, the patient will need an additional supply, with the prescription adjusted. Per Adrienne Jackson, patient has been taking 1/2 a tablet once daily, but she would like the new Rx written for 1 whole tablet by mouth daily. Please assist.  *STAT* If patient is at the pharmacy, call can be transferred to refill team.   1. Which medications need to be refilled? (please list name of each medication and dose if known)   chlorthalidone (HYGROTON) 25 MG tablet    2. Which pharmacy/location (including street and city if local pharmacy) is medication to be sent to? [facility] - [place], [place] - [address], [address]  3. Do they need a 30 day or 90 day supply? 90 day supply

## 2020-09-14 NOTE — Addendum Note (Signed)
Addended by: Tobin Chad on: 09/14/2020 02:17 PM   Modules accepted: Orders

## 2020-09-14 NOTE — Progress Notes (Signed)
Virtual Visit via Telephone Note   This visit type was conducted due to national recommendations for restrictions regarding the COVID-19 Pandemic (e.g. social distancing) in an effort to limit this patient's exposure and mitigate transmission in our community.  Due to her co-morbid illnesses, this patient is at least at moderate risk for complications without adequate follow up.  This format is felt to be most appropriate for this patient at this time.  The patient did not have access to video technology/had technical difficulties with video requiring transitioning to audio format only (telephone).  All issues noted in this document were discussed and addressed.  No physical exam could be performed with this format.  Please refer to the patient's chart for her  consent to telehealth for [facility].   The patient was identified using 2 identifiers.  Date:  09/14/2020   ID:  Adrienne Jackson, DOB 04-05-1941, MRN [id number]  Patient Location: Home Provider Location: Home Office  PCP:  Adrienne Rainier, MD  Cardiologist:  Adrienne Si, MD  Electrophysiologist:  None   Evaluation Performed:  Follow-Up Visit  Chief Complaint:  Aortic stenosis  History of Present Illness:     The patient does not have symptoms concerning for COVID-19 infection (fever, chills, cough, or new shortness of breath).    Adrienne Jackson is a 79 y.o. female with hypertension, hyperlipidemia, moderate aortic stenosis, and frontotemporal dementia here for follow-up.  She was initially seen 07/2017 for the evaluation of near syncope.  Adrienne Jackson was admitted 07/2017 for episodes of loss of consciousness.  The episodes occurred while seated at the table.  They lasted approximately 15 minutes and were associated with word finding difficulty.  There was concern for stroke versus cardiogenic syncope versus seizure.  She had a head CT, CT-A of the neck, and MRI/MRA of the brain that were negative for acute  stroke. Her heart rate was in the 40s on her first admission.  Bisoprolol was discontinued and she had a recurrent event the following day, at which time she was not bradycardic. Echo that admission revealed LVEF 60-65% with normal diastolic function and mild LVH.  She also was noted to have moderate aortic stenosis (mean gradient 29 mmHg).  She followed up with Adrienne Jackson and was referred to cardiology for further evaluation.  She wore a 30-day event monitor that showed occasional PACs and was otherwise unremarkable.  Last year Adrienne Jackson followed up with Adrienne Fabian, NP and was doing OK form a cardiac standpoint. She had a repeat echo 08/2019 that revealed LVEF 60 to 65% with mild LVH and grade 1 diastolic dysfunction.  Her aortic stenosis remains moderate with a mean gradient of 25 mmHg.Marland Kitchen  She has increasing difficulty with her memory and expressive aphasia.  Her sister reports that she is having trouble writing and spelling as well.  Physically she has been fine and has no chest pain or shortness of breath.  She has no lower extremity edema, orthopnea or PND.  They check her blood pressure daily and it has been averaging in the 140s over 90s.  She and her sister live together.  Her sister has more physical ailments but is fine from a cognitive standpoint, so they rely on each other.  Past Medical History:  Diagnosis Date  . Essential hypertension 08/18/2017  . Hyperlipidemia   . Hypertension   . Pure hypercholesterolemia 08/18/2017  . Syncope 08/18/2017  . Thyroid disease     Past Surgical History:  Procedure Laterality Date  .  ABDOMINAL HYSTERECTOMY       Current Outpatient Medications  Medication Sig Dispense Refill  . chlorthalidone (HYGROTON) 25 MG tablet TAKE 0.5 TABLETS BY MOUTH  DAILY. 45 tablet 3  . cholecalciferol (VITAMIN D) 1000 units tablet Take 1,000 Units by mouth daily.    Marland Kitchen escitalopram (LEXAPRO) 20 MG tablet Take 1.5 tablets daily 135 tablet 3  .  levothyroxine (SYNTHROID) 100 MCG tablet Take 100 mcg by mouth daily before breakfast.    . lisinopril (ZESTRIL) 20 MG tablet Take 1 tablet (20 mg total) by mouth daily. 90 tablet 3  . Multiple Vitamins-Minerals (CENTRUM SILVER ADULT 50+ PO) Take by mouth daily.    Ethelda Chick (OYSTER CALCIUM) 500 MG TABS tablet Take 500 mg of elemental calcium by mouth 2 (two) times daily.     . rosuvastatin (CRESTOR) 20 MG tablet Take 1 tablet (20 mg total) by mouth daily. 90 tablet 3   No current facility-administered medications for this visit.    Allergies:   Patient has no known allergies.    Social History:  The patient  reports that she has never smoked. She has never used smokeless tobacco. She reports that she does not drink alcohol and does not use drugs.   Family History:  The patient's family history includes Heart disease in her father.    ROS:  Please see the history of present illness.   Otherwise, review of systems are positive for none.   All other systems are reviewed and negative.    PHYSICAL EXAM: VS:  BP 139/77   Pulse (!) 57   Temp 97.9 F (36.6 C)   Ht 5\' 8"  (1.727 m)   BMI 31.47 kg/m  , BMI Body mass index is 31.47 kg/m. GENERAL:  Well appearing HEENT: Pupils equal round and reactive, fundi not visualized, oral mucosa unremarkable NECK:  No jugular venous distention, waveform within normal limits, carotid upstroke brisk and symmetric, no bruits LUNGS:  Clear to auscultation bilaterally HEART:  RRR.  PMI not displaced or sustained,S1 and S2 within normal limits, no S3, no S4, no clicks, no rubs, no murmurs ABD:  Flat, positive bowel sounds normal in frequency in pitch, no bruits, no rebound, no guarding, no midline pulsatile mass, no hepatomegaly, no splenomegaly EXT:  2 plus pulses throughout, no edema, no cyanosis no clubbing SKIN:  No rashes no nodules NEURO:  Cranial nerves II through XII grossly intact, motor grossly intact throughout PSYCH:  Cognitively intact,  oriented to person place and time  EKG:  EKG is ordered today. The ekg ordered 09/22/17 demonstrates sinus rhythm.  Rate 90 bpm.   12/26/17: Sinus rhythm.  Rate 82 bpm.    Echo 08/18/17: Study Conclusions  - Left ventricle: The cavity size was normal. Wall thickness was   increased in a pattern of mild LVH. Systolic function was normal.   The estimated ejection fraction was in the range of 60% to 65%.   Left ventricular diastolic function parameters were normal. - Aortic valve: There was moderate stenosis. Valve area (VTI): 1.52   cm^2. Valve area (Vmax): 1.63 cm^2. Valve area (Vmean): 1.37   cm^2. - Left atrium: The atrium was mildly dilated. - Atrial septum: A patent foramen ovale cannot be excluded.Study Conclusions  30-day event Monitor 08/24/17:  Quality: Fair.  Baseline artifact. Predominant rhythm: Sinus rhythm Average heart rate: 82 bpm Max heart rate: 124 bpm Min heart rate: 52 bpm  Occasional PACs noted  Echo 08/2019: 1. Left ventricular ejection fraction,  by visual estimation, is 70 to  75%. The left ventricle has normal function. There is mildly increased  left ventricular hypertrophy.  2. Left ventricular diastolic parameters are consistent with Grade I  diastolic dysfunction (impaired relaxation).  3. The left ventricle has no regional wall motion abnormalities.  4. Global right ventricle has normal systolic function.The right  ventricular size is normal.  5. Left atrial size was mildly dilated.  6. Right atrial size was normal.  7. Mild mitral annular calcification.  8. The mitral valve is normal in structure. No evidence of mitral valve  regurgitation. No evidence of mitral stenosis.  9. The tricuspid valve is normal in structure. Tricuspid valve  regurgitation is trivial.  10. The aortic valve has an indeterminant number of cusps. Aortic valve  regurgitation is trivial. Moderate aortic valve stenosis.  11. The pulmonic valve was normal in  structure. Pulmonic valve  regurgitation is not visualized.  12. Normal pulmonary artery systolic pressure.  13. The inferior vena cava is normal in size with greater than 50%  respiratory variability, suggesting right atrial pressure of 3 mmHg.  14. Normal LV systolic function; grade 1 diastolic dysfunction; mild LVH;  calcified aortic valve with moderate AS (mean gradient 30 mmHg) and trace  AI; mild LAE.   FINDINGS   Recent Labs: No results found for requested labs within last 8760 hours.    Lipid Panel    Component Value Date/Time   CHOL 128 08/18/2017 0247   TRIG 52 08/18/2017 0247   HDL 69 08/18/2017 0247   CHOLHDL 1.9 08/18/2017 0247   VLDL 10 08/18/2017 0247   LDLCALC 49 08/18/2017 0247      Wt Readings from Last 3 Encounters:  07/14/20 207 lb (93.9 kg)  12/10/19 192 lb 3.2 oz (87.2 kg)  04/19/19 180 lb (81.6 kg)      ASSESSMENT AND PLAN:  # Syncope vs. Seizure: No recurrent episodes.   Likely bradycardia related. No recurrent bradycardia since stopping metoprolol.  She had an episode of near syncope while having a bowel movement that was vagal mediated.  Discussed the importance of hydration.    # Hypertension: Blood pressure has been better.  Continue lisinopril, and increase chlorthalidone to 25mg  daily.  Repeat BMP in 1 week.  # Moderate aortic stenosis: Mean gradient 29 mmHg.  She is asymptomatic.  Repeat echo 07/2018.  # Hyperlipidemia: LDL 49 07/2017.  Continue rosuvastatin.   Current medicines are reviewed at length with the patient today.  The patient does not have concerns regarding medicines.  The following changes have been made:  none  Labs/ tests ordered today include:   No orders of the defined types were placed in this encounter.   COVID-19 Education: The signs and symptoms of COVID-19 were discussed with the patient and how to seek care for testing (follow up with PCP or arrange E-visit).  The importance of social distancing was  discussed today.  Time:   Today, I have spent 17 minutes with the patient with telehealth technology discussing the above problems.     Disposition:   FU with Lise Pincus C. 08/2017, MD, [facility] in 1 year.  APP in 2 months for BP.    Signed, Lorie Melichar C. [facility], MD, [facility]  09/14/2020 9:30 AM    [facility]

## 2020-09-14 NOTE — Telephone Encounter (Signed)
Rx sent as changed in clinic

## 2020-09-16 NOTE — Telephone Encounter (Signed)
Reviewed AVS with patients daughter, scheduled follow up

## 2020-09-21 ENCOUNTER — Encounter: Payer: Self-pay | Admitting: Podiatry

## 2020-09-21 ENCOUNTER — Ambulatory Visit: Payer: Medicare PPO | Admitting: Podiatry

## 2020-09-21 ENCOUNTER — Other Ambulatory Visit: Payer: Self-pay

## 2020-09-21 DIAGNOSIS — F419 Anxiety disorder, unspecified: Secondary | ICD-10-CM | POA: Diagnosis not present

## 2020-09-21 DIAGNOSIS — M79672 Pain in left foot: Secondary | ICD-10-CM | POA: Diagnosis not present

## 2020-09-21 DIAGNOSIS — B351 Tinea unguium: Secondary | ICD-10-CM | POA: Diagnosis not present

## 2020-09-21 DIAGNOSIS — L84 Corns and callosities: Secondary | ICD-10-CM | POA: Diagnosis not present

## 2020-09-21 DIAGNOSIS — M79675 Pain in left toe(s): Secondary | ICD-10-CM | POA: Diagnosis not present

## 2020-09-21 DIAGNOSIS — E7800 Pure hypercholesterolemia, unspecified: Secondary | ICD-10-CM | POA: Diagnosis not present

## 2020-09-21 DIAGNOSIS — M810 Age-related osteoporosis without current pathological fracture: Secondary | ICD-10-CM | POA: Insufficient documentation

## 2020-09-21 DIAGNOSIS — G3109 Other frontotemporal dementia: Secondary | ICD-10-CM | POA: Diagnosis not present

## 2020-09-21 DIAGNOSIS — Z5181 Encounter for therapeutic drug level monitoring: Secondary | ICD-10-CM | POA: Diagnosis not present

## 2020-09-21 DIAGNOSIS — M2011 Hallux valgus (acquired), right foot: Secondary | ICD-10-CM | POA: Diagnosis not present

## 2020-09-21 DIAGNOSIS — M79674 Pain in right toe(s): Secondary | ICD-10-CM

## 2020-09-21 DIAGNOSIS — M2012 Hallux valgus (acquired), left foot: Secondary | ICD-10-CM | POA: Diagnosis not present

## 2020-09-21 DIAGNOSIS — E039 Hypothyroidism, unspecified: Secondary | ICD-10-CM | POA: Diagnosis not present

## 2020-09-21 DIAGNOSIS — M79671 Pain in right foot: Secondary | ICD-10-CM

## 2020-09-21 DIAGNOSIS — I10 Essential (primary) hypertension: Secondary | ICD-10-CM | POA: Diagnosis not present

## 2020-09-21 DIAGNOSIS — R4701 Aphasia: Secondary | ICD-10-CM | POA: Diagnosis not present

## 2020-09-21 DIAGNOSIS — Q828 Other specified congenital malformations of skin: Secondary | ICD-10-CM | POA: Diagnosis not present

## 2020-09-21 DIAGNOSIS — R635 Abnormal weight gain: Secondary | ICD-10-CM | POA: Diagnosis not present

## 2020-09-21 NOTE — Patient Instructions (Signed)
Onychomycosis/Fungal Toenails  WHAT IS IT? An infection that lies within the keratin of your nail plate that is caused by a fungus.  WHY ME? Fungal infections affect all ages, sexes, races, and creeds.  There may be many factors that predispose you to a fungal infection such as age, coexisting medical conditions such as diabetes, or an autoimmune disease; stress, medications, fatigue, genetics, etc.  Bottom line: fungus thrives in a warm, moist environment and your shoes offer such a location.  IS IT CONTAGIOUS? Theoretically, yes.  You do not want to share shoes, nail clippers or files with someone who has fungal toenails.  Walking around barefoot in the same room or sleeping in the same bed is unlikely to transfer the organism.  It is important to realize, however, that fungus can spread easily from one nail to the next on the same foot.  HOW DO WE TREAT THIS?  There are several ways to treat this condition.  Treatment may depend on many factors such as age, medications, pregnancy, liver and kidney conditions, etc.  It is best to ask your doctor which options are available to you.  1. No treatment.   Unlike many other medical concerns, you can live with this condition.  However for many people this can be a painful condition and may lead to ingrown toenails or a bacterial infection.  It is recommended that you keep the nails cut short to help reduce the amount of fungal nail. 2. Topical treatment.  These range from herbal remedies to prescription strength nail lacquers.  About 40-50% effective, topicals require twice daily application for approximately 9 to 12 months or until an entirely new nail has grown out.  The most effective topicals are medical grade medications available through physicians offices. 3. Oral antifungal medications.  With an 80-90% cure rate, the most common oral medication requires 3 to 4 months of therapy and stays in your system for a year as the new nail grows out.  Oral  antifungal medications do require blood work to make sure it is a safe drug for you.  A liver function panel will be performed prior to starting the medication and after the first month of treatment.  It is important to have the blood work performed to avoid any harmful side effects.  In general, this medication safe but blood work is required. 4. Laser Therapy.  This treatment is performed by applying a specialized laser to the affected nail plate.  This therapy is noninvasive, fast, and non-painful.  It is not covered by insurance and is therefore, out of pocket.  The results have been very good with a 80-95% cure rate.  [facility] is the only practice in the area to offer this therapy. 5. Permanent Nail Avulsion.  Removing the entire nail so that a new nail will not grow back.   Bunion  A bunion is a bump on the base of the big toe that forms when the bones of the big toe joint move out of position. Bunions may be small at first, but they often get larger over time. They can make walking painful. What are the causes? A bunion may be caused by:  Wearing narrow or pointed shoes that force the big toe to press against the other toes.  Abnormal foot development that causes the foot to roll inward (pronate).  Changes in the foot that are caused by certain diseases, such as rheumatoid arthritis or polio.  A foot injury. What increases  the risk? The following factors may make you more likely to develop this condition:  Wearing shoes that squeeze the toes together.  Having certain diseases, such as: ? Rheumatoid arthritis. ? Polio. ? Cerebral palsy.  Having family members who have bunions.  Being born with a foot deformity, such as flat feet or low arches.  Doing activities that put a lot of pressure on the feet, such as ballet dancing. What are the signs or symptoms? The main symptom of a bunion is a noticeable bump on the big toe. Other symptoms may  include:  Pain.  Swelling around the big toe.  Redness and inflammation.  Thick or hardened skin on the big toe or between the toes.  Stiffness or loss of motion in the big toe.  Trouble with walking. How is this diagnosed? A bunion may be diagnosed based on your symptoms, medical history, and activities. You may have tests, such as:  X-rays. These allow your health care provider to check the position of the bones in your foot and look for damage to your joint. They also help your health care provider determine the severity of your bunion and the best way to treat it.  Joint aspiration. In this test, a sample of fluid is removed from the toe joint. This test may be done if you are in a lot of pain. It helps rule out diseases that cause painful swelling of the joints, such as arthritis. How is this treated? Treatment depends on the severity of your symptoms. The goal of treatment is to relieve symptoms and prevent the bunion from getting worse. Your health care provider may recommend:  Wearing shoes that have a wide toe box.  Using bunion pads to cushion the affected area.  Taping your toes together to keep them in a normal position.  Placing a device inside your shoe (orthotics) to help reduce pressure on your toe joint.  Taking medicine to ease pain, inflammation, and swelling.  Applying heat or ice to the affected area.  Doing stretching exercises.  Surgery to remove scar tissue and move the toes back into their normal position. This treatment is rare. Follow these instructions at home: Managing pain, stiffness, and swelling   If directed, put ice on the painful area: ? Put ice in a plastic bag. ? Place a towel between your skin and the bag. ? Leave the ice on for 20 minutes, 2-3 times a day. Activity   If directed, apply heat to the affected area before you exercise. Use the heat source that your health care provider recommends, such as a moist heat pack or a  heating pad. ? Place a towel between your skin and the heat source. ? Leave the heat on for 20-30 minutes. ? Remove the heat if your skin turns bright red. This is especially important if you are unable to feel pain, heat, or cold. You may have a greater risk of getting burned.  Do exercises as told by your health care provider. General instructions  Support your toe joint with proper footwear, shoe padding, or taping as told by your health care provider.  Take over-the-counter and prescription medicines only as told by your health care provider.  Keep all follow-up visits as told by your health care provider. This is important. Contact a health care provider if your symptoms:  Get worse.  Do not improve in 2 weeks. Get help right away if you have:  Severe pain and trouble with walking. Summary  A  bunion is a bump on the base of the big toe that forms when the bones of the big toe joint move out of position.  Bunions can make walking painful.  Treatment depends on the severity of your symptoms.  Support your toe joint with proper footwear, shoe padding, or taping as told by your health care provider. This information is not intended to replace advice given to you by your health care provider. Make sure you discuss any questions you have with your health care provider. Document Revised: 03/19/2018 Document Reviewed: 01/23/2018 Elsevier Patient Education  2020 [employer]  Corns and Calluses Corns are small areas of thickened skin that occur on the top, sides, or tip of a toe. They contain a cone-shaped core with a point that can press on a nerve below. This causes pain.  Calluses are areas of thickened skin that can occur anywhere on the body, including the hands, fingers, palms, soles of the feet, and heels. Calluses are usually larger than corns. What are the causes? Corns and calluses are caused by rubbing (friction) or pressure, such as from shoes that are too tight or do  not fit properly. What increases the risk? Corns are more likely to develop in people who have misshapen toes (toe deformities), such as hammer toes. Calluses can occur with friction to any area of the skin. They are more likely to develop in people who:  Work with their hands.  Wear shoes that fit poorly, are too tight, or are high-heeled.  Have toe deformities. What are the signs or symptoms? Symptoms of a corn or callus include:  A hard growth on the skin.  Pain or tenderness under the skin.  Redness and swelling.  Increased discomfort while wearing tight-fitting shoes, if your feet are affected. If a corn or callus becomes infected, symptoms may include:  Redness and swelling that gets worse.  Pain.  Fluid, blood, or pus draining from the corn or callus. How is this diagnosed? Corns and calluses may be diagnosed based on your symptoms, your medical history, and a physical exam. How is this treated? Treatment for corns and calluses may include:  Removing the cause of the friction or pressure. This may involve: ? Changing your shoes. ? Wearing shoe inserts (orthotics) or other protective layers in your shoes, such as a corn pad. ? Wearing gloves.  Applying medicine to the skin (topical medicine) to help soften skin in the hardened, thickened areas.  Removing layers of dead skin with a file to reduce the size of the corn or callus.  Removing the corn or callus with a scalpel or laser.  Taking antibiotic medicines, if your corn or callus is infected.  Having surgery, if a toe deformity is the cause. Follow these instructions at home:   Take over-the-counter and prescription medicines only as told by your health care provider.  If you were prescribed an antibiotic, take it as told by your health care provider. Do not stop taking it even if your condition starts to improve.  Wear shoes that fit well. Avoid wearing high-heeled shoes and shoes that are too tight or  too loose.  Wear any padding, protective layers, gloves, or orthotics as told by your health care provider.  Soak your hands or feet and then use a file or pumice stone to soften your corn or callus. Do this as told by your health care provider.  Check your corn or callus every day for symptoms of infection. Contact a health care  provider if you:  Notice that your symptoms do not improve with treatment.  Have redness or swelling that gets worse.  Notice that your corn or callus becomes painful.  Have fluid, blood, or pus coming from your corn or callus.  Have new symptoms. Summary  Corns are small areas of thickened skin that occur on the top, sides, or tip of a toe.  Calluses are areas of thickened skin that can occur anywhere on the body, including the hands, fingers, palms, and soles of the feet. Calluses are usually larger than corns.  Corns and calluses are caused by rubbing (friction) or pressure, such as from shoes that are too tight or do not fit properly.  Treatment may include wearing any padding, protective layers, gloves, or orthotics as told by your health care provider. This information is not intended to replace advice given to you by your health care provider. Make sure you discuss any questions you have with your health care provider. Document Revised: 01/02/2019 Document Reviewed: 07/26/2017 Elsevier Patient Education  2020 [employer].

## 2020-09-22 LAB — BASIC METABOLIC PANEL
BUN/Creatinine Ratio: 17 (ref 12–28)
BUN: 16 mg/dL (ref 8–27)
CO2: 26 mmol/L (ref 20–29)
Calcium: 9.8 mg/dL (ref 8.7–10.3)
Chloride: 100 mmol/L (ref 96–106)
Creatinine, Ser: 0.92 mg/dL (ref 0.57–1.00)
GFR calc Af Amer: 68 mL/min/{1.73_m2} (ref >59)
GFR calc non Af Amer: 59 mL/min/{1.73_m2} — ABNORMAL LOW (ref >59)
Glucose: 96 mg/dL (ref 65–99)
Potassium: 3.7 mmol/L (ref 3.5–5.2)
Sodium: 139 mmol/L (ref 134–144)

## 2020-09-25 NOTE — Progress Notes (Signed)
Subjective: Adrienne Jackson presents today referred by Juluis Rainier, MD for complaint of painful porokeratotic lesions right foot.  Pain prevent comfortable ambulation. Aggravating factor is weightbearing with or without shoegear.   Patient's daughter is present during today's visit.   Patient has been followed in the past every 9 weeks. by a Public house manager.   Past Medical History:  Diagnosis Date  . Essential hypertension 08/18/2017  . Hyperlipidemia   . Hypertension   . Pure hypercholesterolemia 08/18/2017  . Syncope 08/18/2017  . Thyroid disease      Patient Active Problem List   Diagnosis Date Noted  . Osteoporosis 09/21/2020  . Thyroid disease   . Hypertension   . Hyperlipidemia   . Language impairment 08/18/2017  . Cognitive impairment 08/18/2017  . Pseudogout of right wrist 08/18/2017  . Undiagnosed cardiac murmurs 08/18/2017  . Abnormal brain MRI 08/18/2017  . Essential hypertension 08/18/2017  . Syncope and collapse 08/18/2017  . Pure hypercholesterolemia 08/18/2017  . Syncope 08/18/2017  . Aphasia   . Confusion   . Moderate aortic stenosis   . Transient neurologic deficit 08/17/2017     Past Surgical History:  Procedure Laterality Date  . ABDOMINAL HYSTERECTOMY       Current Outpatient Medications on File Prior to Visit  Medication Sig Dispense Refill  . chlorthalidone (HYGROTON) 25 MG tablet 1 tablet in the morning with food 90 tablet 3  . Cholecalciferol (VITAMIN D) 50 MCG (2000 UT) tablet 1 tablet    . escitalopram (LEXAPRO) 10 MG tablet 1 tablet    . levothyroxine (SYNTHROID) 100 MCG tablet 1 tablet in the morning on an empty stomach    . lisinopril (ZESTRIL) 20 MG tablet 1 tablet    . Multiple Vitamins-Minerals (CENTRUM SILVER ADULT 50+ PO) Take by mouth daily.    Ethelda Chick (OYSTER CALCIUM) 500 MG TABS tablet Take 500 mg of elemental calcium by mouth 2 (two) times daily.     . rosuvastatin (CRESTOR) 20 MG tablet Take 1 tablet (20  mg total) by mouth daily. 90 tablet 3  . zoledronic acid (RECLAST) 5 MG/100ML SOLN injection See admin instructions.     No current facility-administered medications on file prior to visit.     No Known Allergies   Social History   Occupational History  . Not on file  Tobacco Use  . Smoking status: Never Smoker  . Smokeless tobacco: Never Used  Vaping Use  . Vaping Use: Never used  Substance and Sexual Activity  . Alcohol use: No  . Drug use: No  . Sexual activity: Not Currently     Family History  Problem Relation Age of Onset  . Heart disease Father      Immunization History  Administered Date(s) Administered  . PFIZER SARS-COV-2 Vaccination 10/31/2019, 11/25/2019     Objective: Adrienne Jackson is a pleasant 79 y.o. female WD, WN in NAD. AAO x 3.  Vascular Examination:  Capillary refill time to digits immediate b/l. Palpable pedal pulses b/l LE. Pedal hair sparse. Lower extremity skin temperature gradient within normal limits. Varicosities present b/l.  Dermatological Examination: Pedal skin with normal turgor, texture and tone bilaterally. No open wounds bilaterally. No interdigital macerations bilaterally. Porokeratotic lesion(s) with subdermal hemorrhage  R 2nd toe, R 3rd toe and submet head 3 right foot. No erythema, no edema, no drainage, no fluctuance. No signs of infection.  Toenails 1-5 b/l are painful, elongated, discolored, dystrophic with subungual debris. Pain with dorsal palpation of nailplates. No erythema,  no edema, no drainage noted.  Musculoskeletal: Normal muscle strength 5/5 to all lower extremity muscle groups bilaterally. No pain crepitus or joint limitation noted with ROM b/l. Hallux valgus with bunion deformity noted b/l lower extremities.  Neurological: Protective sensation intact 5/5 intact bilaterally with 10g monofilament b/l. Vibratory sensation intact b/l.   Assessment: 1. Pain due to onychomycosis of toenails of both feet   2.  Porokeratosis   3. Corns   4. Pain in both feet   5. Hallux valgus, acquired, bilateral     Plan: -Examined patient. -Medicare ABN signed for this year. Patient's daughter is POA and consents for services of paring of corns/calluses  today. Copy given to patient on today's visit and copy placed in patient's chart. -Patient to continue soft, supportive shoe gear daily. -Toenails 1-5 b/l were debrided in length and girth with sterile nail nippers and dremel without iatrogenic bleeding.  -Corn(s) R 2nd toe and R 3rd toe pared utilizing sterile scalpel blade without complication or incident. Total number debrided=2. -Painful porokeratotic lesion(s) submet head 3 right foot pared and enucleated with sterile scalpel blade without incident. -Patient to report any pedal injuries to medical professional immediately. -Patient/POA to call should there be question/concern in the interim. -Patient advised to purchase reusable non-medicated U-shaped gel callus cushion at [email or web address] or [employer].  Return in about 9 weeks (around 11/23/2020).  Freddie Breech, DPM

## 2020-11-15 NOTE — Progress Notes (Signed)
Cardiology Clinic Note   Patient Name: Markeeta Scalf Date of Encounter: 11/16/2020  Primary Care Provider:  [name], MD Primary Cardiologist:  Chilton Si, MD  Patient Profile    Shaunna Rosetti 80 year old female presents the clinic today for follow-up evaluation of her essential hypertension, and moderate aortic stenosis.  Past Medical History    Past Medical History:  Diagnosis Date  . Essential hypertension 08/18/2017  . Hyperlipidemia   . Hypertension   . Pure hypercholesterolemia 08/18/2017  . Syncope 08/18/2017  . Thyroid disease    Past Surgical History:  Procedure Laterality Date  . ABDOMINAL HYSTERECTOMY      Allergies  No Known Allergies  History of Present Illness    Ms. Haertel has a PMH of essential hypertension, aortic stenosis, thyroid disease, osteoporosis, cognitive impairment, frontotemporal dementia, hypercholesterolemia, and syncope.  She was admitted 1118 for loss of consciousness.  The episode occurred while she was seated at a table.  It was reported that the event lasted approximately 15 minutes and was associated with difficulty finding her words.  There was concern for CVA versus cardiogenic syncope or seizure.  Her head CTA and MRI/MRA of the brain were negative for acute CVA.  Her heart rate was in the 40s during her first admission.  Her bisoprolol was discontinued and she had a similar event the following day.  During the time of her subsequent event she remained bradycardic.  Her echocardiogram showed an LVEF of 60-65%, normal diastolic function, and mild LVH.  She was noted to have moderate aortic stenosis with a mean gradient of 29 mmHg.  She was referred by Dr. Randye Lobo for further cardiac evaluation.  She wore a 30-day cardiac event monitor which showed occasional PACs and was otherwise unremarkable.  She was seen via virtual platform by me 09/02/2019  and stated she felt well.  She had not had any further  episodes of syncope in over a year.  Her blood pressure was well controlled at home and ranged in the 130s over 70s.  She had moved in with her sister [name].  And stated that they complemented each other very well.  She continued to be very active around her house.  Her sister stated that they had been taking care of very well by the neighbors and been getting deliveries from local grocery store/pharmacy.  They stated they were very happy to be seen virtually and prefered the visit to a an office visit especially during the COVID-19 pandemic.  Follow-up echocardiogram 12/20 showed an LVEF of 60-65%, mild LVH, and G1 DD.  Aortic stenosis remains moderate with a mean gradient of 25 mmHg.  She was seen by Dr. Duke Salvia virtually on 09/14/2020.  During that time she noted increasing difficulty with her memory and expressive aphasia.  Her sister reported that they were having trouble writing and spelling.  She denied chest pain shortness of breath.  She denied lower extremity swelling, orthopnea, and PND.  She reported her blood pressure was slightly elevated 140s over 90s.  She continued to live with her sister and they were relying on each other.  She presents the clinic today for follow-up evaluation with her daughter states she feels well.  Her daughter reports that she is seeing podiatry for calluses on the bottom of her feet.  She continues to decline from a cognitive standpoint.  She continues to manage fairly well with her sister who is mentally sharp but not as physically able.  Her blood pressure  log shows systolic blood pressures in the 130s-140s over 70s and 80s.  She has not noted any further episodes of lightheadedness, presyncope or syncope.  Daughter reports that they occasionally have dietary indiscretion and eat convenience type meals.  We used shared decision making to talk about whether we should repeat her echocardiogram at this time.  We will defer due to continued cognitive decline and  stable physical activity.  I will give her the salty 6 diet sheet, have her maintain her physical activity, and have her follow-up in 6 months.  Today she denies chest pain, shortness of breath, lower extremity edema, fatigue, palpitations, melena, hematuria, hemoptysis, diaphoresis, weakness, presyncope, syncope, orthopnea, and PND.     Home Medications    Prior to Admission medications   Medication Sig Start Date End Date Taking? Authorizing Provider  chlorthalidone (HYGROTON) 25 MG tablet 1 tablet in the morning with food 09/14/20   Chilton Si, MD  Cholecalciferol (VITAMIN D) 50 MCG (2000 UT) tablet 1 tablet    [provider]  escitalopram (LEXAPRO) 10 MG tablet 1 tablet    [provider]  levothyroxine (SYNTHROID) 100 MCG tablet 1 tablet in the morning on an empty stomach    [provider]  lisinopril (ZESTRIL) 20 MG tablet 1 tablet    [provider]  Multiple Vitamins-Minerals (CENTRUM SILVER ADULT 50+ PO) Take by mouth daily.    [provider]  Ethelda Chick (OYSTER CALCIUM) 500 MG TABS tablet Take 500 mg of elemental calcium by mouth 2 (two) times daily.     [provider]  rosuvastatin (CRESTOR) 20 MG tablet Take 1 tablet (20 mg total) by mouth daily. 09/30/19   Chilton Si, MD  zoledronic acid (RECLAST) 5 MG/100ML SOLN injection See admin instructions.    [provider]    Family History    Family History  Problem Relation Age of Onset  . Heart disease Father    She indicated that her father is deceased.  Social History    Social History   Socioeconomic History  . Marital status: Single    Spouse name: Not on file  . Number of children: Not on file  . Years of education: Not on file  . Highest education level: Not on file  Occupational History  . Not on file  Tobacco Use  . Smoking status: Never Smoker  . Smokeless tobacco: Never Used  Vaping Use  . Vaping Use: Never used  Substance  and Sexual Activity  . Alcohol use: No  . Drug use: No  . Sexual activity: Not Currently  Other Topics Concern  . Not on file  Social History Narrative   Lives with sister in a two story home      Right handed      Highest level of edu- 12th grade   Social Determinants of Health   Financial Resource Strain: Not on file  Food Insecurity: Not on file  Transportation Needs: Not on file  Physical Activity: Not on file  Stress: Not on file  Social Connections: Not on file  Intimate Partner Violence: Not on file     Review of Systems    General:  No chills, fever, night sweats or weight changes.  Cardiovascular:  No chest pain, dyspnea on exertion, edema, orthopnea, palpitations, paroxysmal nocturnal dyspnea. Dermatological: No rash, lesions/masses Respiratory: No cough, dyspnea Urologic: No hematuria, dysuria Abdominal:   No nausea, vomiting, diarrhea, bright red blood per rectum, melena, or hematemesis Neurologic:  No visual changes, wkns, changes in mental status. All other systems reviewed and are otherwise negative except as noted above.  Physical Exam    VS:  BP (!) 160/80   Pulse 73   Ht 5\' 8"  (1.727 m)   Wt 203 lb 9.6 oz (92.4 kg)   SpO2 98%   BMI 30.96 kg/m  , BMI Body mass index is 30.96 kg/m. GEN: Well nourished, well developed, in no acute distress. HEENT: normal. Neck: Supple, no JVD, carotid bruits, or masses. Cardiac: RRR, no murmurs, rubs, or gallops. No clubbing, cyanosis, edema.  Radials/DP/PT 2+ and equal bilaterally.  Respiratory:  Respirations regular and unlabored, clear to auscultation bilaterally. GI: Soft, nontender, nondistended, BS + x 4. MS: no deformity or atrophy. Skin: warm and dry, no rash. Neuro:  Strength and sensation are intact. Psych: Normal affect.  Accessory Clinical Findings    Recent Labs: 09/21/2020: BUN 16; Creatinine, Ser 0.92; Potassium 3.7; Sodium 139   Recent Lipid Panel    Component Value Date/Time   CHOL 128  08/18/2017 0247   TRIG 52 08/18/2017 0247   HDL 69 08/18/2017 0247   CHOLHDL 1.9 08/18/2017 0247   VLDL 10 08/18/2017 0247   LDLCALC 49 08/18/2017 0247    ECG personally reviewed by me today-normal sinus rhythm cannot rule out anterior infarct undetermined age ST and T wave abnormality consider inferior ischemia 73 bpm- No acute changes  Echocardiogram 08/2019 1. Left ventricular ejection fraction, by visual estimation, is 70 to  75%. The left ventricle has normal function. There is mildly increased  left ventricular hypertrophy.  2. Left ventricular diastolic parameters are consistent with Grade I  diastolic dysfunction (impaired relaxation).  3. The left ventricle has no regional wall motion abnormalities.  4. Global right ventricle has normal systolic function.The right  ventricular size is normal.  5. Left atrial size was mildly dilated.  6. Right atrial size was normal.  7. Mild mitral annular calcification.  8. The mitral valve is normal in structure. No evidence of mitral valve  regurgitation. No evidence of mitral stenosis.  9. The tricuspid valve is normal in structure. Tricuspid valve  regurgitation is trivial.  10. The aortic valve has an indeterminant number of cusps. Aortic valve  regurgitation is trivial. Moderate aortic valve stenosis.  11. The pulmonic valve was normal in structure. Pulmonic valve  regurgitation is not visualized.  12. Normal pulmonary artery systolic pressure.  13. The inferior vena cava is normal in size with greater than 50%  respiratory variability, suggesting right atrial pressure of 3 mmHg.  14. Normal LV systolic function; grade 1 diastolic dysfunction; mild LVH;  calcified aortic valve with moderate AS (mean gradient 30 mmHg) and trace  AI; mild LAE.  Assessment & Plan   1.  Essential hypertension-BP today 160/80.  Well controlled at home with increase in chlorthalidone. 130's-140's over 70-80. BMP stable 09/21/2020 Continue  chlorthalidone, lisinopril Heart healthy low-sodium diet-salty 6 given Increase physical activity as tolerated Maintain blood pressure log   Moderate aortic stenosis- Follow-up echocardiogram 12/20 showed an LVEF of 60-65%, mild LVH, and G1 DD.  Aortic stenosis remains moderate with a mean gradient of 25 mmHg. Repeat when clinically indicated  Hyperlipidemia- LDL 62 03/05/20 Continue rosuvastatin Heart healthy low-sodium diet-salty 6 given Increase physical activity as tolerated  Seizure versus syncope-denies recent episodes of presyncope and syncope/lightheadedness/faintness.  Previous episode was felt to be related to bradycardia.  Has not had any further episodes with discontinuation of metoprolol.  Reported  to Dr. Duke Salviaandolph 09/14/2020 that she had an episode of near syncope while having a bowel movement which was felt to be vagal response in nature.  Increase p.o. hydration was encouraged. Maintain p.o. hydration  Disposition: Follow-up with Dr. Duke Salviaandolph in 6 months.   Thomasene RippleJesse M. Vasti Yagi NP-C    11/16/2020, 9:06 AM [facility] [address] Office [phone] Fax [phone]  Notice: This dictation was prepared with Dragon dictation along with smaller phrase technology. Any transcriptional errors that result from this process are unintentional and may not be corrected upon review.  I spent 12 minutes examining this patient, reviewing medications, and using patient centered shared decision making involving her cardiac care.  Prior to her visit I spent greater than 20 minutes reviewing her past medical history,  medications, and prior cardiac tests.

## 2020-11-16 ENCOUNTER — Ambulatory Visit: Payer: Medicare PPO | Admitting: General Practice

## 2020-11-16 ENCOUNTER — Other Ambulatory Visit: Payer: Self-pay

## 2020-11-16 ENCOUNTER — Encounter: Payer: Self-pay | Admitting: General Practice

## 2020-11-16 VITALS — BP 160/80 | HR 73 | Ht 68.0 in | Wt 203.6 lb

## 2020-11-16 DIAGNOSIS — M810 Age-related osteoporosis without current pathological fracture: Secondary | ICD-10-CM | POA: Diagnosis not present

## 2020-11-16 DIAGNOSIS — I350 Nonrheumatic aortic (valve) stenosis: Secondary | ICD-10-CM

## 2020-11-16 DIAGNOSIS — E7800 Pure hypercholesterolemia, unspecified: Secondary | ICD-10-CM

## 2020-11-16 DIAGNOSIS — R55 Syncope and collapse: Secondary | ICD-10-CM

## 2020-11-16 DIAGNOSIS — I10 Essential (primary) hypertension: Secondary | ICD-10-CM | POA: Diagnosis not present

## 2020-11-16 DIAGNOSIS — Z1231 Encounter for screening mammogram for malignant neoplasm of breast: Secondary | ICD-10-CM | POA: Diagnosis not present

## 2020-11-16 DIAGNOSIS — G3109 Other frontotemporal dementia: Secondary | ICD-10-CM | POA: Diagnosis not present

## 2020-11-16 DIAGNOSIS — E039 Hypothyroidism, unspecified: Secondary | ICD-10-CM | POA: Diagnosis not present

## 2020-11-16 MED ORDER — CHLORTHALIDONE 25 MG PO TABS: ORAL_TABLET | ORAL | 3 refills | Status: DC

## 2020-11-16 MED ORDER — ROSUVASTATIN CALCIUM 20 MG PO TABS: 20.0000 mg | ORAL_TABLET | Freq: Every day | ORAL | 3 refills | Status: DC

## 2020-11-16 MED ORDER — LISINOPRIL 20 MG PO TABS: ORAL_TABLET | ORAL | 3 refills | Status: DC

## 2020-11-16 NOTE — Addendum Note (Signed)
Addended by: Alyson Ingles on: 11/16/2020 09:29 AM   Modules accepted: Orders

## 2020-11-16 NOTE — Patient Instructions (Signed)
Medication Instructions:  The current medical regimen is effective;  continue present plan and medications as directed. Please refer to the Current Medication list given to you today.  *If you need a refill on your cardiac medications before your next appointment, please call your pharmacy*  Lab Work:   Testing/Procedures:  NONE    NONE  Special Instructions PLEASE READ AND FOLLOW SALTY 6-ATTACHED-1,800mg  daily  PLEASE INCREASE PHYSICAL ACTIVITY AS TOLERATED  Follow-Up: Your next appointment:  6 month(s) In Person with Chilton Si, MD OR IF UNAVAILABLE JESSE CLEAVER, FNP-C  At [facility], you and your health needs are our priority.  As part of our continuing mission to provide you with exceptional heart care, we have created designated Provider Care Teams.  These Care Teams include your primary Cardiologist (physician) and Advanced Practice Providers (APPs -  Physician Assistants and Nurse Practitioners) who all work together to provide you with the care you need, when you need it.            6 SALTY THINGS TO AVOID     1,800MG  DAILY

## 2020-11-25 DIAGNOSIS — N6001 Solitary cyst of right breast: Secondary | ICD-10-CM | POA: Diagnosis not present

## 2020-11-25 DIAGNOSIS — R928 Other abnormal and inconclusive findings on diagnostic imaging of breast: Secondary | ICD-10-CM | POA: Diagnosis not present

## 2020-12-02 ENCOUNTER — Encounter: Payer: Self-pay | Admitting: Neurology

## 2020-12-02 NOTE — Progress Notes (Signed)
Adrienne Jackson Key: [id number] - PA Case ID: [id number] - Rx #: [id number] Need help? Call us at [phone] Outcome Approvedtoday PA Case: [id number], Status: Approved, Coverage Starts on: 09/26/2020 12:00:00 AM, Coverage Ends on: 09/25/2021 12:00:00 AM. Questions? Contact [phone]. Drug Escitalopram Oxalate 20MG  tablets Form [employer] Electronic PA Form

## 2020-12-04 ENCOUNTER — Encounter: Payer: Self-pay | Admitting: Podiatry

## 2020-12-04 ENCOUNTER — Other Ambulatory Visit: Payer: Self-pay

## 2020-12-04 ENCOUNTER — Ambulatory Visit: Payer: Medicare PPO | Admitting: Podiatry

## 2020-12-04 DIAGNOSIS — B351 Tinea unguium: Secondary | ICD-10-CM

## 2020-12-04 DIAGNOSIS — T148XXA Other injury of unspecified body region, initial encounter: Secondary | ICD-10-CM | POA: Diagnosis not present

## 2020-12-04 DIAGNOSIS — M79674 Pain in right toe(s): Secondary | ICD-10-CM | POA: Diagnosis not present

## 2020-12-04 DIAGNOSIS — M79671 Pain in right foot: Secondary | ICD-10-CM | POA: Diagnosis not present

## 2020-12-04 DIAGNOSIS — M2012 Hallux valgus (acquired), left foot: Secondary | ICD-10-CM

## 2020-12-04 DIAGNOSIS — M2011 Hallux valgus (acquired), right foot: Secondary | ICD-10-CM | POA: Diagnosis not present

## 2020-12-04 DIAGNOSIS — M79675 Pain in left toe(s): Secondary | ICD-10-CM

## 2020-12-04 DIAGNOSIS — L84 Corns and callosities: Secondary | ICD-10-CM

## 2020-12-04 DIAGNOSIS — Q828 Other specified congenital malformations of skin: Secondary | ICD-10-CM

## 2020-12-04 DIAGNOSIS — M79672 Pain in left foot: Secondary | ICD-10-CM | POA: Diagnosis not present

## 2020-12-04 MED ORDER — MUPIROCIN 2 % EX OINT: TOPICAL_OINTMENT | CUTANEOUS | 1 refills | Status: DC

## 2020-12-04 NOTE — Patient Instructions (Signed)
Apply Mupirocin Ointment and fabric Band-Aid to left bunion area once daily.  Wear wide shoes with stretchable uppers and memory foam insoles daily.

## 2020-12-06 NOTE — Progress Notes (Signed)
Subjective: Adrienne Jackson presents today referred by [name], MD for complaint of painful porokeratotic lesions right foot.  Pain prevent comfortable ambulation. Aggravating factor is weightbearing with or without shoegear.   Patient's daughter is present during today's visit. She has been able to remove some shoes from her mother's closet, but her aunt does assist her Mom with dressing. Daughter states her aunt has been applying felt pads to [name]'s feet.    Objective: Adrienne Jackson is a pleasant 80 y.o. female WD, WN in NAD. AAO x 3.  Vascular Examination:  Capillary refill time to digits immediate b/l. Palpable pedal pulses b/l LE. Pedal hair sparse. Lower extremity skin temperature gradient within normal limits. Varicosities present b/l.  Dermatological Examination: Pedal skin with normal turgor, texture and tone bilaterally. No open wounds bilaterally. No interdigital macerations bilaterally. Porokeratotic lesion(s) with subdermal hemorrhage  R 2nd toe, R 3rd toe and submet head 3 right foot. No erythema, no edema, no drainage, no fluctuance. No signs of infection.  Toenails 1-5 b/l are painful, elongated, discolored, dystrophic with subungual debris. Pain with dorsal palpation of nailplates. No erythema, no edema, no drainage noted.  Left bunion with small area of irritation. Blanchable erythema, no edema, no drainage, no fluctuance.  Musculoskeletal: Normal muscle strength 5/5 to all lower extremity muscle groups bilaterally. No pain crepitus or joint limitation noted with ROM b/l. Hallux valgus with bunion deformity noted b/l lower extremities.  Neurological: Protective sensation intact 5/5 intact bilaterally with 10g monofilament b/l. Vibratory sensation intact b/l.   Assessment: 1. Pain due to onychomycosis of toenails of both feet   2. Porokeratosis   3. Corns   4. Hallux valgus, acquired, bilateral   5. Abrasion   6. Pain in both feet      Plan: -Examined patient. -Patient to continue soft, supportive shoe gear daily. Advised shoes with stretchable uppers and memory foam insoles. -Toenails 1-5 b/l were debrided in length and girth with sterile nail nippers and dremel without iatrogenic bleeding.  -Rx written for Mupirocin ointment to be applied to bunion area once daily until healed. -Corn(s) R 2nd toe and R 3rd toe pared utilizing sterile scalpel blade without complication or incident. Total number debrided=2. -Painful porokeratotic lesion(s) submet head 3 right foot pared and enucleated with sterile scalpel blade without incident. -Patient to report any pedal injuries to medical professional immediately. -Patient/POA to call should there be question/concern in the interim. -Patient advised to purchase reusable non-medicated U-shaped gel callus cushion at [email or web address] or [employer].  Return in about 3 months (around 03/06/2021).  Freddie Breech, DPM

## 2021-01-14 ENCOUNTER — Telehealth: Payer: Self-pay | Admitting: Cardiovascular Disease

## 2021-01-14 NOTE — Telephone Encounter (Signed)
   Pt would like to stay going to [facility] office, they would like to switch provider from Dr. Schleswig to Dr. Acharya 

## 2021-02-01 ENCOUNTER — Telehealth: Payer: Self-pay | Admitting: Internal Medicine

## 2021-02-01 NOTE — Telephone Encounter (Signed)
   [facility] Pre-operative Risk Assessment    Patient Name: Adrienne Jackson  DOB: 1941/04/11  MRN: [id number]   HEARTCARE STAFF: - Please ensure there is not already an duplicate clearance open for this procedure. - Under Visit Info/Reason for Call, type in Other and utilize the format Clearance MM/DD/YY or Clearance TBD. Do not use dashes or single digits. - If request is for dental extraction, please clarify the # of teeth to be extracted.  Request for surgical clearance:   PT is currently in the chair at the dental office   1. What type of surgery is being performed?  Bridge and Extraction   2. When is this surgery scheduled? Today 5/9   3. What type of clearance is required (medical clearance vs. Pharmacy clearance to hold med vs. Both)? Pharmacy , they need to know if pt is required to be pre medicated   4. Are there any medications that need to be held prior to surgery and how long?na    5. Practice name and name of physician performing surgery? Dr Windy Carina   6. What is the office phone number? [facility]   What is the office fax number? [phone]  8.   Anesthesia type (None, local, MAC, general) ? General    Shana A Stovall 02/01/2021, 10:15 AM  _________________________________________________________________   (provider comments below)

## 2021-02-01 NOTE — Telephone Encounter (Signed)
Chart reviewed by DOD, Dr. Rennis Golden. NO pre-meds for dental work needed.   Relayed this to dentist office via phone  Will also fax to dentist office

## 2021-02-18 DIAGNOSIS — E039 Hypothyroidism, unspecified: Secondary | ICD-10-CM | POA: Diagnosis not present

## 2021-02-18 DIAGNOSIS — E559 Vitamin D deficiency, unspecified: Secondary | ICD-10-CM | POA: Diagnosis not present

## 2021-02-18 DIAGNOSIS — Z6829 Body mass index (BMI) 29.0-29.9, adult: Secondary | ICD-10-CM | POA: Diagnosis not present

## 2021-02-18 DIAGNOSIS — M810 Age-related osteoporosis without current pathological fracture: Secondary | ICD-10-CM | POA: Diagnosis not present

## 2021-03-08 NOTE — Progress Notes (Signed)
Assessment/Plan:   Frontotemporal Dementia:   80 year old right-handed woman with a history of frontotemporal dementia, worsened from prior visit.  An MMSE could not be performed, as the patient was not able to communicate. Mood is controlled with Lexapro 30 mg daily.  Discussed safety both in and out of the home. Continue 24/7 care Discussed the importance of regular daily schedule  Continue to monitor mood with Lexapro 30 ng daily, refills sent to the pharmacy Stay active at least 30 minutes at least 3 times a week.  Home PT being arranged, for balance and strength. Follow up in  6 months.   Case discussed with Dr. Karel Jarvis who agrees with the plan     Subjective:   Adrienne Jackson is a 79 y.o. female RH woman with a history of hypertension, hyperlipidemia, hypothyroidism, and a history of frontotemporal dementia diagnosed by Neuropsychological testing, seen today in follow up. She was last seen on a virtual visit on 07/14/20. This patient is accompanied in the office by her daughter Misty Stanley who supplements the history  Previous records as well as any outside records available were reviewed prior to todays visit.   Her daughter reports that the patient has increasing expressive aphasia, unable to complete sentences or communicate her needs.  Misty Stanley reports that her long-term and short-term memory are declining, if a phone call was made, she does not remember.  She is also unable to complete sentences.  She lives with her sister, who  "pushes her to try to recall, and that makes things worse ".  They spend the whole day together, which makes" at times annoyed at each other ". They are trying to get  a sitter for company and to help around the house 2 times a Her sister was hospitalized, and they had to fly another sister from out of state to be with her, so these sitter will be very convenient to have. They were entertaining a living facility for her for 24/7 care, but the sister would have  to come with her, and" these facilities do not allow that" . She still picks her clothes to dress up, and with the help of the sister, makes her own coffee on the [place] machine.  For meals, her sister  t now she has to actually point the foods directly facing at her. Her appetite is good and no trouble swallowing is reported.  Although she showers by herself, the sister has to show her where the shampoo is, because she has been using body cream to wash her hair. She sleeps well.  No hallucinations or paranoia are reported.  Bedroom is upstairs, so there in the process of getting an electric seat to go from the first floor to the second floor.  Her sister manages the medications, she has no missed doses. The patient has not been very active, or walking outside, as her sister is in a wheelchair, so the daughter feels that the patient is deconditioned.  No apparent headaches, dizziness, focal numbness, tingling, weakness or falls.  No recent hospitalizations     History on Initial Assessment 09/10/2018: This is a pleasant 80 year old right-handed woman with a history of hypertension, hyperlipidemia, hypothyroidism, presenting to establish care for dementia. Records from [place], [place] were reviewed. Her daughter reports that the diagnosis of frontotemporal dementia came about when she started having episodes of passing out and slumping over in May 2018. She initially saw neurologist Dr. Glenda Chroman and had a normal 72-hour EEG. MRI brain showed  global atrophic changes. It was also felt she had underlying dementia at that point. She had 2 episodes in one day last Thanksgiving 2018 where she had a CTA head and neck and another MRI brain which were unremarkable. She was started on Keppra for presumed seizures. She was evaluated by epileptologist Dr. Jobe Gibbon and had inpatient vEEG monitoring which showed left hemisphere slowing, particularly involving the frontotemporal parietal region, no epileptiform discharges. MRI in April  2018 showed generalized atrophic changes significantly worse across left hemisphere. It was felt that EEG did not support diagnosis of epilepsy, and that syncopal events were related to BP fluctuations. Keppra was discontinued and Depakote started for mood, anxiety, and sleep. She underwent Neuropsychological testing in April 2019 with findings consistent with a likely diagnosis of the language variant of frontotemporal dementia.   Her daughter reports that she has not had any further syncopal episodes since BP medications were adjusted in November 2018. Over the past year, she has noticed more cognitive decline, needing to repeat instructions, difficulty with multi-step commands. She would get the wrong thing from the fridge. She was making fish last Christmas and could not understand the recipe card. She has more word-finding difficulties, causing more anxiety due to difficulty communicating. She is on Lexapro 10mg  daily without side effects. She stopped driving in May 2019 after driving evaluation recommended no further driving. She moved in with her sister living in [place] who manages her medications. She is independent with dressing and bathing. No paranoia or hallucinations. She denies any headaches, dizziness, vision changes, focal numbness/tingling/weakness, neck/back pain, bowel/bladder dysfunction, anosmia, or tremors. No further staring/unresponsive episodes per daughter. She denies any olfactory/gustatory hallucinations, myoclonic jerks. No family history of dementia, no history of concussions. She drinks alcohol socially.     PREVIOUS MEDICATIONS: None  CURRENT MEDICATIONS:  Outpatient Encounter Medications as of 03/09/2021  Medication Sig   chlorthalidone (HYGROTON) 25 MG tablet 1 tablet in the morning with food   Cholecalciferol (VITAMIN D) 50 MCG (2000 UT) tablet 1 tablet   levothyroxine (SYNTHROID) 100 MCG tablet 1 tablet in the morning on an empty stomach   lisinopril (ZESTRIL) 20  MG tablet 1 tablet   Multiple Vitamins-Minerals (CENTRUM SILVER ADULT 50+ PO) Take by mouth daily.   Oyster Shell (OYSTER CALCIUM) 500 MG TABS tablet Take 500 mg of elemental calcium by mouth 2 (two) times daily.    rosuvastatin (CRESTOR) 20 MG tablet Take 1 tablet (20 mg total) by mouth daily.   zoledronic acid (RECLAST) 5 MG/100ML SOLN injection See admin instructions.   [DISCONTINUED] escitalopram (LEXAPRO) 20 MG tablet Taking 1.5 tablets daily.   escitalopram (LEXAPRO) 20 MG tablet Taking 1.5 tablets daily.   mupirocin ointment (BACTROBAN) 2 % Apply to left foot once daily until healed. (Patient not taking: Reported on 03/09/2021)   No facility-administered encounter medications on file as of 03/09/2021.     Objective:     PHYSICAL EXAMINATION:    VITALS:   Vitals:   03/09/21 0737  BP: 118/71  Pulse: 72  SpO2: 97%  Weight: 181 lb (82.1 kg)  Height: 5\' 7"  (1.702 m)    GEN:  The patient appears stated age and is in NAD. HEENT:  Normocephalic, atraumatic.   Neurological examination:  General: NAD, well-groomed, appears stated age. Orientation: The patient is alert.  Unable to communicate due to aphasia, answering just "yes" to every question Cranial nerves: There is good facial symmetry.The speech is not fluent due to expressive aphasia . No  dysarthria. Fund of knowledge is reduced,  Recent and remote memory are impaired. Attention and concentration are reduced.  Unable to name objects and repeat phrases.  Hearing is intact to conversational tone.  Able to follow commands  Sensation: Sensation is intact to light touch throughout Motor: Strength is at least antigravity x4.  No flowsheet data found.   MMSE - Mini Mental State Exam 12/10/2019 04/19/2019 09/10/2018  Orientation to time 1 3 1   Orientation to Place 0 2 0  Registration 3 3 3   Attention/ Calculation 0 0 1  Recall 0 0 0  Language- name 2 objects 0 2 1  Language- repeat 0 0 1  Language- follow 3 step command 3 3  3   Language- read & follow direction 1 0 0  Write a sentence 0 0 1  Copy design 1 0 1  Total score 9 13 12       Movement examination: Tone: There is normal tone in the UE/LE Abnormal movements:  no tremor.  No myoclonus.  No asterixis.   Coordination:  There is no decremation with RAM's. Normal finger to nose  Gait and Station: The patient has no difficulty arising out of a deep-seated chair without the use of the hands. The patient's stride length is good.  Gait is cautious and narrow.    CBC CBC Latest Ref Rng & Units 08/19/2017 08/18/2017 08/17/2017  WBC 4.0 - 10.5 K/uL 6.8 9.3 -  Hemoglobin 12.0 - 15.0 g/dL 08/21/2017 11.9(L)  Hematocrit 36.0 - 46.0 % 37.4 36.8 35.0(L)  Platelets 150 - 400 K/uL 143(L) 134(L) -     CMP Latest Ref Rng & Units 09/21/2020 12/14/2017 10/30/2017  Glucose 65 - 99 mg/dL 96 85 87  BUN 8 - 27 mg/dL 16 13 13   Creatinine 0.57 - 1.00 mg/dL 90.2 09/23/2020 12/16/2017  Sodium 134 - 144 mmol/L 139 140 140  Potassium 3.5 - 5.2 mmol/L 3.7 4.2 4.2  Chloride 96 - 106 mmol/L 100 99 103  CO2 20 - 29 mmol/L 26 25 21   Calcium 8.7 - 10.3 mg/dL 9.8 9.6 9.3       Total time spent on today's visit was 45 minutes, including both face-to-face time and nonface-to-face time. Time included that spent on review of records (prior notes available to me/labs/imaging if pertinent), discussing treatment and goals, answering patient's questions and coordinating care.  Cc:  Rankins, 12/28/2017, MD , PA-C

## 2021-03-09 ENCOUNTER — Encounter: Payer: Self-pay | Admitting: Physician Assistant

## 2021-03-09 ENCOUNTER — Ambulatory Visit (INDEPENDENT_AMBULATORY_CARE_PROVIDER_SITE_OTHER): Payer: Medicare PPO | Admitting: Physician Assistant

## 2021-03-09 ENCOUNTER — Other Ambulatory Visit: Payer: Self-pay

## 2021-03-09 VITALS — BP 118/71 | HR 72 | Ht 67.0 in | Wt 181.0 lb

## 2021-03-09 DIAGNOSIS — F0280 Dementia in other diseases classified elsewhere without behavioral disturbance: Secondary | ICD-10-CM

## 2021-03-09 DIAGNOSIS — G3109 Other frontotemporal dementia: Secondary | ICD-10-CM | POA: Diagnosis not present

## 2021-03-09 MED ORDER — ESCITALOPRAM OXALATE 20 MG PO TABS: ORAL_TABLET | ORAL | 3 refills | Status: DC

## 2021-03-09 NOTE — Patient Instructions (Addendum)
Great seeing you! Continue Lexapro 20mg : take 1.5 tablets daily, refill sent to the Pharmacy.  Physical Therapy for balance (at home)   Follow-up in 6 months, call for any changes   FALL PRECAUTIONS: Be cautious when walking. Scan the area for obstacles that may increase the risk of trips and falls. When getting up in the mornings, sit up at the edge of the bed for a few minutes before getting out of bed. Consider elevating the bed at the head end to avoid drop of blood pressure when getting up. Walk always in a well-lit room (use night lights in the walls). Avoid area rugs or power cords from appliances in the middle of the walkways. Use a walker or a cane if necessary and consider physical therapy for balance exercise. Get your eyesight checked regularly.  HOME SAFETY: Consider the safety of the kitchen when operating appliances like stoves, microwave oven, and blender. Consider having supervision and share cooking responsibilities until no longer able to participate in those. Accidents with firearms and other hazards in the house should be identified and addressed as well.   ABILITY TO BE LEFT ALONE: If patient is unable to contact 911 operator, consider using LifeLine, or when the need is there, arrange for someone to stay with patients. Smoking is a fire hazard, consider supervision or cessation. Risk of wandering should be assessed by caregiver and if detected at any point, supervision and safe proof recommendations should be instituted.   RECOMMENDATIONS FOR ALL PATIENTS WITH MEMORY PROBLEMS: 1. Continue to exercise (Recommend 30 minutes of walking everyday, or 3 hours every week) 2. Increase social interactions - continue going to [place] and enjoy social gatherings with friends and family 3. Eat healthy, avoid fried foods and eat more fruits and vegetables 4. Maintain adequate blood pressure, blood sugar, and blood cholesterol level. Reducing the risk of stroke and cardiovascular disease  also helps promoting better memory. 5. Avoid stressful situations. Live a simple life and avoid aggravations. Organize your time and prepare for the next day in anticipation. 6. Sleep well, avoid any interruptions of sleep and avoid any distractions in the bedroom that may interfere with adequate sleep quality 7. Avoid sugar, avoid sweets as there is a strong link between excessive sugar intake, diabetes, and cognitive impairment The Mediterranean diet has been shown to help patients reduce the risk of progressive memory disorders and reduces cardiovascular risk. This includes eating fish, eat fruits and green leafy vegetables, nuts like almonds and hazelnuts, walnuts, and also use olive oil. Avoid fast foods and fried foods as much as possible. Avoid sweets and sugar as sugar use has been linked to worsening of memory function.  There is always a concern of gradual progression of memory problems. If this is the case, then we may need to adjust level of care according to patient needs. Support, both to the patient and caregiver, should then be put into place.

## 2021-03-13 DIAGNOSIS — B351 Tinea unguium: Secondary | ICD-10-CM | POA: Diagnosis not present

## 2021-03-13 DIAGNOSIS — L84 Corns and callosities: Secondary | ICD-10-CM | POA: Diagnosis not present

## 2021-03-13 DIAGNOSIS — F0280 Dementia in other diseases classified elsewhere without behavioral disturbance: Secondary | ICD-10-CM | POA: Diagnosis not present

## 2021-03-13 DIAGNOSIS — M2012 Hallux valgus (acquired), left foot: Secondary | ICD-10-CM | POA: Diagnosis not present

## 2021-03-13 DIAGNOSIS — M2011 Hallux valgus (acquired), right foot: Secondary | ICD-10-CM | POA: Diagnosis not present

## 2021-03-13 DIAGNOSIS — E039 Hypothyroidism, unspecified: Secondary | ICD-10-CM | POA: Diagnosis not present

## 2021-03-13 DIAGNOSIS — E785 Hyperlipidemia, unspecified: Secondary | ICD-10-CM | POA: Diagnosis not present

## 2021-03-13 DIAGNOSIS — Q828 Other specified congenital malformations of skin: Secondary | ICD-10-CM | POA: Diagnosis not present

## 2021-03-13 DIAGNOSIS — G3109 Other frontotemporal dementia: Secondary | ICD-10-CM | POA: Diagnosis not present

## 2021-03-16 ENCOUNTER — Ambulatory Visit: Payer: Medicare PPO | Admitting: Neurology

## 2021-03-23 ENCOUNTER — Telehealth: Payer: Self-pay | Admitting: Physician Assistant

## 2021-03-23 NOTE — Telephone Encounter (Signed)
Victorino Dike was called and given verbal orders for home health physical therapy 2x for 4 weeks and 1x for 4 weeks.

## 2021-03-23 NOTE — Telephone Encounter (Signed)
Byrd Hesselbach called in and needs verbal approval for Silvia to get home health physical therapy 2x for 4 weeks and 1x for 4 weeks.

## 2021-03-25 ENCOUNTER — Telehealth: Payer: Self-pay | Admitting: Physician Assistant

## 2021-03-25 NOTE — Telephone Encounter (Signed)
Cara from [facility] called and left a message requesting a call back for verbal orders.

## 2021-03-26 ENCOUNTER — Ambulatory Visit: Payer: Medicare PPO | Admitting: Podiatry

## 2021-03-26 NOTE — Telephone Encounter (Signed)
Charlynne Pander called back left a voice mail to call the office back about the verbal orders she needed

## 2021-03-29 DIAGNOSIS — Q828 Other specified congenital malformations of skin: Secondary | ICD-10-CM | POA: Diagnosis not present

## 2021-03-29 DIAGNOSIS — M2012 Hallux valgus (acquired), left foot: Secondary | ICD-10-CM | POA: Diagnosis not present

## 2021-03-29 DIAGNOSIS — E785 Hyperlipidemia, unspecified: Secondary | ICD-10-CM | POA: Diagnosis not present

## 2021-03-29 DIAGNOSIS — M2011 Hallux valgus (acquired), right foot: Secondary | ICD-10-CM | POA: Diagnosis not present

## 2021-03-29 DIAGNOSIS — E039 Hypothyroidism, unspecified: Secondary | ICD-10-CM | POA: Diagnosis not present

## 2021-03-29 DIAGNOSIS — B351 Tinea unguium: Secondary | ICD-10-CM | POA: Diagnosis not present

## 2021-03-29 DIAGNOSIS — G3109 Other frontotemporal dementia: Secondary | ICD-10-CM | POA: Diagnosis not present

## 2021-03-29 DIAGNOSIS — F0280 Dementia in other diseases classified elsewhere without behavioral disturbance: Secondary | ICD-10-CM | POA: Diagnosis not present

## 2021-03-29 DIAGNOSIS — L84 Corns and callosities: Secondary | ICD-10-CM | POA: Diagnosis not present

## 2021-03-31 DIAGNOSIS — B351 Tinea unguium: Secondary | ICD-10-CM | POA: Diagnosis not present

## 2021-03-31 DIAGNOSIS — G3109 Other frontotemporal dementia: Secondary | ICD-10-CM | POA: Diagnosis not present

## 2021-03-31 DIAGNOSIS — E039 Hypothyroidism, unspecified: Secondary | ICD-10-CM | POA: Diagnosis not present

## 2021-03-31 DIAGNOSIS — M2011 Hallux valgus (acquired), right foot: Secondary | ICD-10-CM | POA: Diagnosis not present

## 2021-03-31 DIAGNOSIS — M2012 Hallux valgus (acquired), left foot: Secondary | ICD-10-CM | POA: Diagnosis not present

## 2021-03-31 DIAGNOSIS — F0280 Dementia in other diseases classified elsewhere without behavioral disturbance: Secondary | ICD-10-CM | POA: Diagnosis not present

## 2021-03-31 DIAGNOSIS — L84 Corns and callosities: Secondary | ICD-10-CM | POA: Diagnosis not present

## 2021-03-31 DIAGNOSIS — Q828 Other specified congenital malformations of skin: Secondary | ICD-10-CM | POA: Diagnosis not present

## 2021-03-31 DIAGNOSIS — E785 Hyperlipidemia, unspecified: Secondary | ICD-10-CM | POA: Diagnosis not present

## 2021-03-31 NOTE — Telephone Encounter (Signed)
Signed orders faxed back today.

## 2021-04-06 DIAGNOSIS — E039 Hypothyroidism, unspecified: Secondary | ICD-10-CM | POA: Diagnosis not present

## 2021-04-06 DIAGNOSIS — M2012 Hallux valgus (acquired), left foot: Secondary | ICD-10-CM | POA: Diagnosis not present

## 2021-04-06 DIAGNOSIS — G3109 Other frontotemporal dementia: Secondary | ICD-10-CM | POA: Diagnosis not present

## 2021-04-06 DIAGNOSIS — E785 Hyperlipidemia, unspecified: Secondary | ICD-10-CM | POA: Diagnosis not present

## 2021-04-06 DIAGNOSIS — Q828 Other specified congenital malformations of skin: Secondary | ICD-10-CM | POA: Diagnosis not present

## 2021-04-06 DIAGNOSIS — F0280 Dementia in other diseases classified elsewhere without behavioral disturbance: Secondary | ICD-10-CM | POA: Diagnosis not present

## 2021-04-06 DIAGNOSIS — L84 Corns and callosities: Secondary | ICD-10-CM | POA: Diagnosis not present

## 2021-04-06 DIAGNOSIS — B351 Tinea unguium: Secondary | ICD-10-CM | POA: Diagnosis not present

## 2021-04-06 DIAGNOSIS — M2011 Hallux valgus (acquired), right foot: Secondary | ICD-10-CM | POA: Diagnosis not present

## 2021-04-08 DIAGNOSIS — M2012 Hallux valgus (acquired), left foot: Secondary | ICD-10-CM | POA: Diagnosis not present

## 2021-04-08 DIAGNOSIS — F0280 Dementia in other diseases classified elsewhere without behavioral disturbance: Secondary | ICD-10-CM | POA: Diagnosis not present

## 2021-04-08 DIAGNOSIS — Q828 Other specified congenital malformations of skin: Secondary | ICD-10-CM | POA: Diagnosis not present

## 2021-04-08 DIAGNOSIS — B351 Tinea unguium: Secondary | ICD-10-CM | POA: Diagnosis not present

## 2021-04-08 DIAGNOSIS — G3109 Other frontotemporal dementia: Secondary | ICD-10-CM | POA: Diagnosis not present

## 2021-04-08 DIAGNOSIS — L84 Corns and callosities: Secondary | ICD-10-CM | POA: Diagnosis not present

## 2021-04-08 DIAGNOSIS — E039 Hypothyroidism, unspecified: Secondary | ICD-10-CM | POA: Diagnosis not present

## 2021-04-08 DIAGNOSIS — E785 Hyperlipidemia, unspecified: Secondary | ICD-10-CM | POA: Diagnosis not present

## 2021-04-08 DIAGNOSIS — M2011 Hallux valgus (acquired), right foot: Secondary | ICD-10-CM | POA: Diagnosis not present

## 2021-04-12 DIAGNOSIS — B351 Tinea unguium: Secondary | ICD-10-CM | POA: Diagnosis not present

## 2021-04-12 DIAGNOSIS — M2012 Hallux valgus (acquired), left foot: Secondary | ICD-10-CM | POA: Diagnosis not present

## 2021-04-12 DIAGNOSIS — Q828 Other specified congenital malformations of skin: Secondary | ICD-10-CM | POA: Diagnosis not present

## 2021-04-12 DIAGNOSIS — E039 Hypothyroidism, unspecified: Secondary | ICD-10-CM | POA: Diagnosis not present

## 2021-04-12 DIAGNOSIS — M2011 Hallux valgus (acquired), right foot: Secondary | ICD-10-CM | POA: Diagnosis not present

## 2021-04-12 DIAGNOSIS — L84 Corns and callosities: Secondary | ICD-10-CM | POA: Diagnosis not present

## 2021-04-12 DIAGNOSIS — F0280 Dementia in other diseases classified elsewhere without behavioral disturbance: Secondary | ICD-10-CM | POA: Diagnosis not present

## 2021-04-12 DIAGNOSIS — E785 Hyperlipidemia, unspecified: Secondary | ICD-10-CM | POA: Diagnosis not present

## 2021-04-12 DIAGNOSIS — G3109 Other frontotemporal dementia: Secondary | ICD-10-CM | POA: Diagnosis not present

## 2021-04-13 DIAGNOSIS — M2012 Hallux valgus (acquired), left foot: Secondary | ICD-10-CM | POA: Diagnosis not present

## 2021-04-13 DIAGNOSIS — F0280 Dementia in other diseases classified elsewhere without behavioral disturbance: Secondary | ICD-10-CM | POA: Diagnosis not present

## 2021-04-13 DIAGNOSIS — B351 Tinea unguium: Secondary | ICD-10-CM | POA: Diagnosis not present

## 2021-04-13 DIAGNOSIS — G3109 Other frontotemporal dementia: Secondary | ICD-10-CM | POA: Diagnosis not present

## 2021-04-13 DIAGNOSIS — L84 Corns and callosities: Secondary | ICD-10-CM | POA: Diagnosis not present

## 2021-04-13 DIAGNOSIS — E039 Hypothyroidism, unspecified: Secondary | ICD-10-CM | POA: Diagnosis not present

## 2021-04-13 DIAGNOSIS — E785 Hyperlipidemia, unspecified: Secondary | ICD-10-CM | POA: Diagnosis not present

## 2021-04-13 DIAGNOSIS — Q828 Other specified congenital malformations of skin: Secondary | ICD-10-CM | POA: Diagnosis not present

## 2021-04-13 DIAGNOSIS — M2011 Hallux valgus (acquired), right foot: Secondary | ICD-10-CM | POA: Diagnosis not present

## 2021-04-20 DIAGNOSIS — F0280 Dementia in other diseases classified elsewhere without behavioral disturbance: Secondary | ICD-10-CM | POA: Diagnosis not present

## 2021-04-20 DIAGNOSIS — L84 Corns and callosities: Secondary | ICD-10-CM | POA: Diagnosis not present

## 2021-04-20 DIAGNOSIS — M2011 Hallux valgus (acquired), right foot: Secondary | ICD-10-CM | POA: Diagnosis not present

## 2021-04-20 DIAGNOSIS — E785 Hyperlipidemia, unspecified: Secondary | ICD-10-CM | POA: Diagnosis not present

## 2021-04-20 DIAGNOSIS — B351 Tinea unguium: Secondary | ICD-10-CM | POA: Diagnosis not present

## 2021-04-20 DIAGNOSIS — M2012 Hallux valgus (acquired), left foot: Secondary | ICD-10-CM | POA: Diagnosis not present

## 2021-04-20 DIAGNOSIS — Q828 Other specified congenital malformations of skin: Secondary | ICD-10-CM | POA: Diagnosis not present

## 2021-04-20 DIAGNOSIS — E039 Hypothyroidism, unspecified: Secondary | ICD-10-CM | POA: Diagnosis not present

## 2021-04-20 DIAGNOSIS — G3109 Other frontotemporal dementia: Secondary | ICD-10-CM | POA: Diagnosis not present

## 2021-04-27 DIAGNOSIS — M2012 Hallux valgus (acquired), left foot: Secondary | ICD-10-CM | POA: Diagnosis not present

## 2021-04-27 DIAGNOSIS — L84 Corns and callosities: Secondary | ICD-10-CM | POA: Diagnosis not present

## 2021-04-27 DIAGNOSIS — E039 Hypothyroidism, unspecified: Secondary | ICD-10-CM | POA: Diagnosis not present

## 2021-04-27 DIAGNOSIS — Q828 Other specified congenital malformations of skin: Secondary | ICD-10-CM | POA: Diagnosis not present

## 2021-04-27 DIAGNOSIS — M2011 Hallux valgus (acquired), right foot: Secondary | ICD-10-CM | POA: Diagnosis not present

## 2021-04-27 DIAGNOSIS — B351 Tinea unguium: Secondary | ICD-10-CM | POA: Diagnosis not present

## 2021-04-27 DIAGNOSIS — F0280 Dementia in other diseases classified elsewhere without behavioral disturbance: Secondary | ICD-10-CM | POA: Diagnosis not present

## 2021-04-27 DIAGNOSIS — E785 Hyperlipidemia, unspecified: Secondary | ICD-10-CM | POA: Diagnosis not present

## 2021-04-27 DIAGNOSIS — G3109 Other frontotemporal dementia: Secondary | ICD-10-CM | POA: Diagnosis not present

## 2021-05-04 DIAGNOSIS — E785 Hyperlipidemia, unspecified: Secondary | ICD-10-CM | POA: Diagnosis not present

## 2021-05-04 DIAGNOSIS — B351 Tinea unguium: Secondary | ICD-10-CM | POA: Diagnosis not present

## 2021-05-04 DIAGNOSIS — F0280 Dementia in other diseases classified elsewhere without behavioral disturbance: Secondary | ICD-10-CM | POA: Diagnosis not present

## 2021-05-04 DIAGNOSIS — M2011 Hallux valgus (acquired), right foot: Secondary | ICD-10-CM | POA: Diagnosis not present

## 2021-05-04 DIAGNOSIS — L84 Corns and callosities: Secondary | ICD-10-CM | POA: Diagnosis not present

## 2021-05-04 DIAGNOSIS — M2012 Hallux valgus (acquired), left foot: Secondary | ICD-10-CM | POA: Diagnosis not present

## 2021-05-04 DIAGNOSIS — G3109 Other frontotemporal dementia: Secondary | ICD-10-CM | POA: Diagnosis not present

## 2021-05-04 DIAGNOSIS — Q828 Other specified congenital malformations of skin: Secondary | ICD-10-CM | POA: Diagnosis not present

## 2021-05-04 DIAGNOSIS — E039 Hypothyroidism, unspecified: Secondary | ICD-10-CM | POA: Diagnosis not present

## 2021-05-07 ENCOUNTER — Encounter: Payer: Self-pay | Admitting: Podiatry

## 2021-05-07 ENCOUNTER — Other Ambulatory Visit: Payer: Self-pay

## 2021-05-07 ENCOUNTER — Ambulatory Visit: Payer: Medicare PPO | Admitting: Podiatry

## 2021-05-07 DIAGNOSIS — B351 Tinea unguium: Secondary | ICD-10-CM

## 2021-05-07 DIAGNOSIS — E559 Vitamin D deficiency, unspecified: Secondary | ICD-10-CM | POA: Insufficient documentation

## 2021-05-07 DIAGNOSIS — M79675 Pain in left toe(s): Secondary | ICD-10-CM | POA: Diagnosis not present

## 2021-05-07 DIAGNOSIS — M79674 Pain in right toe(s): Secondary | ICD-10-CM | POA: Diagnosis not present

## 2021-05-10 NOTE — Progress Notes (Signed)
Subjective: Adrienne Jackson is a pleasant 80 y.o. female patient seen today for painful corn right 3rd toe and porokeratotic lesion plantar aspect of right foot. She also has  painful thick toenails that are difficult to trim. Pain interferes with ambulation. Aggravating factors include wearing enclosed shoe gear. Pain is relieved with periodic professional debridement.  Her daughter is present during today's visit. Daughter has purchased the [employer] and has also removed leather shoe gear from her Mom's closet.   They voice no new pedal problems on today's visit.  PCP is Rankins, Fanny Dance, MD.   No Known Allergies  Objective: Physical Exam  General: Adrienne Jackson is a pleasant 80 y.o. Caucasian female, WD, WN in NAD. AAO x 3.   Vascular:  Capillary refill time to digits immediate b/l. Palpable DP pulse(s) b/l lower extremities Palpable PT pulse(s) b/l lower extremities Pedal hair sparse. Lower extremity skin temperature gradient within normal limits. No pain with calf compression b/l. No edema noted b/l lower extremities. Varicosities present b/l.  Dermatological:  Pedal skin with normal turgor, texture and tone b/l lower extremities. No open wounds b/l lower extremities. No interdigital macerations b/l lower extremities. Toenails 1-5 b/l elongated, discolored, dystrophic, thickened, crumbly with subungual debris and tenderness to dorsal palpation. No hyperkeratotic nor porokeratotic lesions present on today's visit.  Musculoskeletal:  Normal muscle strength 5/5 to all lower extremity muscle groups bilaterally. Hallux valgus with bunion deformity noted b/l lower extremities.  Neurological:  Protective sensation intact 5/5 intact bilaterally with 10g monofilament b/l. Vibratory sensation intact b/l.  Assessment and Plan:  1. Pain due to onychomycosis of toenails of both feet      -Examined patient. -Adrienne Jackson did sign Medicare ABN consenting for paring of  lesions today. Due to recommended change in shoe gear her lesions have resolved!  -Medicare ABN signed for this year. Patient consents for services of paring of corns and calluses  today. Copy has been placed in patient's chart. -Patient to continue soft, supportive shoe gear daily. -Toenails 1-5 b/l were debrided in length and girth with sterile nail nippers and dremel without iatrogenic bleeding.  -Patient to report any pedal injuries to medical professional immediately. -Patient/POA to call should there be question/concern in the interim.  Return in about 3 months (around 08/07/2021).  Adrienne Jackson, DPM

## 2021-05-25 DIAGNOSIS — I10 Essential (primary) hypertension: Secondary | ICD-10-CM | POA: Diagnosis not present

## 2021-05-25 DIAGNOSIS — E7800 Pure hypercholesterolemia, unspecified: Secondary | ICD-10-CM | POA: Diagnosis not present

## 2021-05-25 DIAGNOSIS — G3109 Other frontotemporal dementia: Secondary | ICD-10-CM | POA: Diagnosis not present

## 2021-05-25 DIAGNOSIS — Z23 Encounter for immunization: Secondary | ICD-10-CM | POA: Diagnosis not present

## 2021-05-25 DIAGNOSIS — I350 Nonrheumatic aortic (valve) stenosis: Secondary | ICD-10-CM | POA: Diagnosis not present

## 2021-05-25 DIAGNOSIS — E039 Hypothyroidism, unspecified: Secondary | ICD-10-CM | POA: Diagnosis not present

## 2021-05-25 DIAGNOSIS — M810 Age-related osteoporosis without current pathological fracture: Secondary | ICD-10-CM | POA: Diagnosis not present

## 2021-05-25 DIAGNOSIS — K921 Melena: Secondary | ICD-10-CM | POA: Diagnosis not present

## 2021-06-17 ENCOUNTER — Ambulatory Visit: Payer: Medicare PPO | Admitting: Internal Medicine

## 2021-06-17 ENCOUNTER — Other Ambulatory Visit: Payer: Self-pay

## 2021-06-17 ENCOUNTER — Encounter: Payer: Self-pay | Admitting: Internal Medicine

## 2021-06-17 VITALS — BP 130/72 | HR 68 | Ht 68.0 in | Wt 180.2 lb

## 2021-06-17 DIAGNOSIS — R55 Syncope and collapse: Secondary | ICD-10-CM | POA: Diagnosis not present

## 2021-06-17 DIAGNOSIS — E7800 Pure hypercholesterolemia, unspecified: Secondary | ICD-10-CM | POA: Diagnosis not present

## 2021-06-17 DIAGNOSIS — I10 Essential (primary) hypertension: Secondary | ICD-10-CM

## 2021-06-17 DIAGNOSIS — I350 Nonrheumatic aortic (valve) stenosis: Secondary | ICD-10-CM | POA: Diagnosis not present

## 2021-06-17 MED ORDER — ROSUVASTATIN CALCIUM 20 MG PO TABS: 20.0000 mg | ORAL_TABLET | Freq: Every day | ORAL | 3 refills | Status: DC

## 2021-06-17 MED ORDER — LISINOPRIL 20 MG PO TABS: ORAL_TABLET | ORAL | 3 refills | Status: DC

## 2021-06-17 MED ORDER — CHLORTHALIDONE 25 MG PO TABS: ORAL_TABLET | ORAL | 3 refills | Status: DC

## 2021-06-17 NOTE — Patient Instructions (Addendum)
Medication Instructions:  No Changes In Medications at this time.  *If you need a refill on your cardiac medications before your next appointment, please call your pharmacy*  Testing/Procedures: Your physician has requested that you have an echocardiogram. Echocardiography is a painless test that uses sound waves to create images of your heart. It provides your doctor with information about the size and shape of your heart and how well your heart's chambers and valves are working. You may receive an ultrasound enhancing agent through an IV if needed to better visualize your heart during the echo.This procedure takes approximately one hour. There are no restrictions for this procedure. This will take place at the [address].   Follow-Up: At [facility], you and your health needs are our priority.  As part of our continuing mission to provide you with exceptional heart care, we have created designated Provider Care Teams.  These Care Teams include your primary Cardiologist (physician) and Advanced Practice Providers (APPs -  Physician Assistants and Nurse Practitioners) who all work together to provide you with the care you need, when you need it.  Your next appointment:   AFTER TESTING   The format for your next appointment:   In Person  Provider:   Weston Brass, MD OR Marjie Skiff PA-C, OR HAO MENG PA-C

## 2021-06-17 NOTE — Progress Notes (Signed)
Cardiology Office Note:    Date:  06/17/2021   ID:  Adrienne Jackson, DOB Apr 03, 1941, MRN [id number]  PCP:  [name], MD  Cardiologist:  Chilton Si, MD  Electrophysiologist:  None   Referring MD: [name], MD   Chief Complaint/Reason for Referral: Follow up, transfer of care  History of Present Illness:    Adrienne Jackson is a 80 y.o. female with a history of hypertension, hyperlipidemia, moderate aortic stenosis, and frontotemporal dementia here for follow-up. Prior patient of Dr. Duke Salvia, transferred care due to office location. I am happy to assume care. Seen with her daughter who provides additional history.  She notes a few months of worsening shortness of breath. She is less active more recently. She has been seen last 11/06/20 by Edd Fabian NP, at which time she was felt to be stable and repeat echo deferred. She is also followed by cardiology for management of HTN.  The patient denies chest pain, chest pressure, palpitations, PND, orthopnea, or leg swelling. Denies cough, fever, chills. Denies nausea, vomiting. Denies recent syncope or presyncope. Denies dizziness or lightheadedness.   Past Medical History:  Diagnosis Date   Essential hypertension 08/18/2017   Hyperlipidemia    Hypertension    Pure hypercholesterolemia 08/18/2017   Syncope 08/18/2017   Thyroid disease     Past Surgical History:  Procedure Laterality Date   ABDOMINAL HYSTERECTOMY      Current Medications: Current Meds  Medication Sig   chlorthalidone (HYGROTON) 25 MG tablet 1 tablet in the morning with food   Cholecalciferol (VITAMIN D) 50 MCG (2000 UT) tablet 1 tablet   escitalopram (LEXAPRO) 20 MG tablet Taking 1.5 tablets daily.   ferrous sulfate 324 MG TBEC Take 324 mg by mouth.   levothyroxine (SYNTHROID) 100 MCG tablet 1 tablet in the morning on an empty stomach   lisinopril (ZESTRIL) 20 MG tablet 1 tablet   Multiple Vitamins-Minerals (CENTRUM SILVER  ADULT 50+ PO) Take by mouth daily.   mupirocin ointment (BACTROBAN) 2 % Apply to left foot once daily until healed.   Oyster Shell (OYSTER CALCIUM) 500 MG TABS tablet Take 500 mg of elemental calcium by mouth 2 (two) times daily.    rosuvastatin (CRESTOR) 20 MG tablet Take 1 tablet (20 mg total) by mouth daily.   zoledronic acid (RECLAST) 5 MG/100ML SOLN injection See admin instructions.     Allergies:   Patient has no known allergies.   Social History   Tobacco Use   Smoking status: Never   Smokeless tobacco: Never  Vaping Use   Vaping Use: Never used  Substance Use Topics   Alcohol use: No   Drug use: No     Family History: The patient's family history includes Heart disease in her father.  ROS:   Please see the history of present illness.    All other systems reviewed and are negative.  EKGs/Labs/Other Studies Reviewed:    The following studies were reviewed today:  EKG:  NSR, Nonspecific T wave  Imaging studies that I have independently reviewed today: n/a  Recent Labs: 09/21/2020: BUN 16; Creatinine, Ser 0.92; Potassium 3.7; Sodium 139  Recent Lipid Panel    Component Value Date/Time   CHOL 128 08/18/2017 0247   TRIG 52 08/18/2017 0247   HDL 69 08/18/2017 0247   CHOLHDL 1.9 08/18/2017 0247   VLDL 10 08/18/2017 0247   LDLCALC 49 08/18/2017 0247    Physical Exam:    VS:  BP 130/72   Pulse 68  Ht 5\' 8"  (1.727 m)   Wt 180 lb 3.2 oz (81.7 kg)   SpO2 95%   BMI 27.40 kg/m     Wt Readings from Last 5 Encounters:  06/17/21 180 lb 3.2 oz (81.7 kg)  03/09/21 181 lb (82.1 kg)  11/16/20 203 lb 9.6 oz (92.4 kg)  07/14/20 207 lb (93.9 kg)  12/10/19 192 lb 3.2 oz (87.2 kg)    Constitutional: No acute distress Eyes: sclera non-icteric, normal conjunctiva and lids ENMT: normal dentition, moist mucous membranes Cardiovascular: regular rhythm, normal rate, no murmurs. S1 and S2 normal. Radial pulses normal bilaterally. No jugular venous distention.   Respiratory: clear to auscultation bilaterally GI : normal bowel sounds, soft and nontender. No distention.   MSK: extremities warm, well perfused. No edema.  NEURO: grossly nonfocal exam, moves all extremities. PSYCH: alert and oriented x 3, normal mood and affect.   ASSESSMENT:    1. Essential hypertension   2. Moderate aortic stenosis   3. Pure hypercholesterolemia   4. Syncope, unspecified syncope type    PLAN:    Essential hypertension - Plan: EKG 12-Lead - stable today. Continue chlorthalidone 25 mg daily and lisinopril 20 mg daily.   Moderate aortic stenosis - Plan: ECHOCARDIOGRAM COMPLETE - participated in shared decision making and deteremined repeat echo indicated to follow AS per patient and family preference. Will order.  Pure hypercholesterolemia - continue crestor 20 mg daily.   Syncope, unspecified syncope type - no recurrence.  Total time of encounter: 30 minutes total time of encounter, including 20 minutes spent in face-to-face patient care on the date of this encounter. This time includes coordination of care and counseling regarding above mentioned problem list. Remainder of non-face-to-face time involved reviewing chart documents/testing relevant to the patient encounter and documentation in the medical record. I have independently reviewed documentation from referring provider.   12/12/19, MD, [facility]  [facility]   Shared Decision Making/Informed Consent:       Medication Adjustments/Labs and Tests Ordered: Current medicines are reviewed at length with the patient today.  Concerns regarding medicines are outlined above.   Orders Placed This Encounter  Procedures   EKG 12-Lead   ECHOCARDIOGRAM COMPLETE    Meds ordered this encounter  Medications   : chlorthalidone (HYGROTON) 25 MG tablet    Sig: 1 tablet in the morning with food    Dispense:  90 tablet    Refill:  3    Cancel Order to to [facility]   : lisinopril (ZESTRIL) 20  MG tablet    Sig: 1 tablet    Dispense:  90 tablet    Refill:  3   : rosuvastatin (CRESTOR) 20 MG tablet    Sig: Take 1 tablet (20 mg total) by mouth daily.    Dispense:  90 tablet    Refill:  3    Patient Instructions  Medication Instructions:  No Changes In Medications at this time.  *If you need a refill on your cardiac medications before your next appointment, please call your pharmacy*  Testing/Procedures: Your physician has requested that you have an echocardiogram. Echocardiography is a painless test that uses sound waves to create images of your heart. It provides your doctor with information about the size and shape of your heart and how well your heart's chambers and valves are working. You may receive an ultrasound enhancing agent through an IV if needed to better visualize your heart during the echo.This procedure takes approximately one hour. There  are no restrictions for this procedure. This will take place at the [address].   Follow-Up: At [facility], you and your health needs are our priority.  As part of our continuing mission to provide you with exceptional heart care, we have created designated Provider Care Teams.  These Care Teams include your primary Cardiologist (physician) and Advanced Practice Providers (APPs -  Physician Assistants and Nurse Practitioners) who all work together to provide you with the care you need, when you need it.  Your next appointment:   AFTER TESTING   The format for your next appointment:   In Person  Provider:   Weston Brass, MD OR Marjie Skiff PA-C, OR HAO MENG PA-C

## 2021-06-18 ENCOUNTER — Other Ambulatory Visit: Payer: Self-pay

## 2021-06-24 ENCOUNTER — Telehealth: Payer: Self-pay

## 2021-06-24 MED ORDER — LISINOPRIL 20 MG PO TABS: 20.0000 mg | ORAL_TABLET | Freq: Every day | ORAL | 3 refills | Status: DC

## 2021-06-24 NOTE — Telephone Encounter (Signed)
Received fax from [facility] requesting Dosing Frequency being update on patient's Lisinopril medication. Confirmed with patients daughter (okay per DPR) that patient is currently taking Lisinopril 20mg  Daily. Advised patient's daughter that new updated prescription has been sent in and to call back with any issues. Patients daughter verbalized understanding.

## 2021-07-09 ENCOUNTER — Ambulatory Visit (HOSPITAL_COMMUNITY): Payer: Medicare PPO | Attending: Internal Medicine

## 2021-07-09 ENCOUNTER — Other Ambulatory Visit: Payer: Self-pay

## 2021-07-09 DIAGNOSIS — K649 Unspecified hemorrhoids: Secondary | ICD-10-CM | POA: Diagnosis not present

## 2021-07-09 DIAGNOSIS — I350 Nonrheumatic aortic (valve) stenosis: Secondary | ICD-10-CM

## 2021-07-09 DIAGNOSIS — I10 Essential (primary) hypertension: Secondary | ICD-10-CM | POA: Diagnosis not present

## 2021-07-09 DIAGNOSIS — G3109 Other frontotemporal dementia: Secondary | ICD-10-CM | POA: Diagnosis not present

## 2021-07-09 DIAGNOSIS — G4701 Insomnia due to medical condition: Secondary | ICD-10-CM | POA: Diagnosis not present

## 2021-07-09 DIAGNOSIS — F419 Anxiety disorder, unspecified: Secondary | ICD-10-CM | POA: Diagnosis not present

## 2021-07-09 LAB — ECHOCARDIOGRAM COMPLETE
AR max vel: 1.33 cm2
AV Area VTI: 1.4 cm2
AV Area mean vel: 1.39 cm2
AV Mean grad: 24 mmHg
AV Peak grad: 50.7 mmHg
Ao pk vel: 3.56 m/s
Area-P 1/2: 3.3 cm2
S' Lateral: 2.1 cm

## 2021-07-14 NOTE — Progress Notes (Signed)
Cardiology Office Note:    Date:  07/23/2021   ID:  Adrienne Jackson, DOB 07-Jun-1941, MRN [id number]  PCP:  [name], MD  Cardiologist:  Parke Poisson, MD  Electrophysiologist:  None   Referring MD: [name], MD   Chief Complaint: follow-up of shortness of breath  History of Present Illness:    Adrienne Jackson is a 80 y.o. female with a history of mild to moderate aortic stenosis on Echo in 06/2021, hypertension, hyperlipidemia, hypothyroidism, and dementia who was previously followed by Dr. Duke Salvia but now follows with Dr. Jacques Navy and presents today for follow-up of shortness of breath.  Patient was admitted  in 07/2017 for syncope. Syncopal episode occurred while she was seated at a table and she was reportedly unconscious for 15 minutes. This episode was associated with difficulty finding her words so there was a concern for stroke vs cardiogenic syncope vs seizure.Head CTA and brain MRI/MRA were negative for acute stroke. She was noted to be bradycardic with rates in the 40s so her beta-blocker wad stopped. She had a similar episode the following day. Echo showed LVEF of 60-65% with mild LVH, normal diastolic function, and moderate AS. Outpatient Event Monitor showed occasional PACs but was otherwise unremarkable. Syncopal episode was felt to likely be due to bradycardia.   Patient was last seen by Dr. Jacques Navy on 06/17/2021 at which time she reported worsening shortness of breath over the last couple of months. Echo was ordered for further evaluation and showed LVEF of 65-70% with normal wall motion, mild LVH, and mild to moderate AS.  Patient presents today for follow-up. Here with daughter Adrienne Jackson. Patient is non-verbal from frontotemporal dementia. She is here today to follow-up from Echo. Shortly after I entered the room, patient began gazing off and then started gradually slumping over. Initially, when I called her name she would look more alert and  respond to me. However, she quickly became less alert. She never lost a pulse or stopped breathing. We lowered her down on the examine table. Code Blue was initiated so that we had full support of office staff (but again she never lost a pulse or stopped breathing). Once supine, she became more alert but was more slow to respond to commands. BP was initially 150/74. After this event, BP was 88/45 and heart rate was in the 65 bpm.Blood glucose was 122. EKG showed normal sinus rhythm, rate 66 bpm, with non-specific S/T changes. She does seem to of had bowel incontinence with this. Unclear whether she had a bowel movement and then had a vasovagal event with this or had an absent seizure and then lost control of her bowels.   She denies having any pain anywhere. No chest pain or headache. However, daughter states she did point to the middle of her head before this event. Initially left pupil looked smaller than the right pupil but this improvement by the time EMS arrived. Neuro exam grossly normal.  Past Medical History:  Diagnosis Date   Aortic stenosis    Hyperlipidemia    Hypertension    Hypothyroidism    Syncope 08/18/2017    Past Surgical History:  Procedure Laterality Date   ABDOMINAL HYSTERECTOMY      Current Medications: Current Meds  Medication Sig   chlorthalidone (HYGROTON) 25 MG tablet 1 tablet in the morning with food   Cholecalciferol (VITAMIN D) 50 MCG (2000 UT) tablet 1 tablet   escitalopram (LEXAPRO) 20 MG tablet Taking 1.5 tablets daily.   ferrous  sulfate 324 MG TBEC Take 324 mg by mouth.   levothyroxine (SYNTHROID) 100 MCG tablet 1 tablet in the morning on an empty stomach   lisinopril (ZESTRIL) 20 MG tablet Take 1 tablet (20 mg total) by mouth daily.   Multiple Vitamins-Minerals (CENTRUM SILVER ADULT 50+ PO) Take by mouth daily.   mupirocin ointment (BACTROBAN) 2 % Apply to left foot once daily until healed.   Oyster Shell (OYSTER CALCIUM) 500 MG TABS tablet Take 500 mg  of elemental calcium by mouth 2 (two) times daily.    rosuvastatin (CRESTOR) 20 MG tablet Take 1 tablet (20 mg total) by mouth daily.   zoledronic acid (RECLAST) 5 MG/100ML SOLN injection See admin instructions.     Allergies:   Patient has no known allergies.   Social History   Socioeconomic History   Marital status: Single    Spouse name: Not on file   Number of children: Not on file   Years of education: Not on file   Highest education level: Not on file  Occupational History   Not on file  Tobacco Use   Smoking status: Never   Smokeless tobacco: Never  Vaping Use   Vaping Use: Never used  Substance and Sexual Activity   Alcohol use: No   Drug use: No   Sexual activity: Not Currently  Other Topics Concern   Not on file  Social History Narrative   Lives with sister in a two story home      Right handed      Highest level of edu- 12th grade   Social Determinants of Health   Financial Resource Strain: Not on file  Food Insecurity: Not on file  Transportation Needs: Not on file  Physical Activity: Not on file  Stress: Not on file  Social Connections: Not on file     Family History: The patient's family history includes Heart disease in her father.  ROS:   Please see the history of present illness.     EKGs/Labs/Other Studies Reviewed:    The following studies were reviewed today:  Event Monitor 08/24/2017 to 09/22/2017: Quality: Fair.  Baseline artifact. Predominant rhythm: Sinus rhythm Average heart rate: 82 bpm Max heart rate: 124 bpm Min heart rate: 52 bpm   Occasional PACs noted _______________  Echocardiogram 07/09/2021: Impressions:  1. The aortic valve is calcified. Aortic valve regurgitation is mild and  central in nature. Mild to moderate aortic valve stenosis.   2. Valve Parameters: peak gradient 43 mm Hg, mean gradient 24 mm Hg, DVI  0.45, LV SVI 57 ml/m2, AVA 1.4 cm2.   3. Left ventricular ejection fraction, by estimation, is 65 to  70%. The  left ventricle has normal function. The left ventricle has no regional  wall motion abnormalities. There is mild concentric left ventricular  hypertrophy. Left ventricular diastolic  parameters are indeterminate.   4. Right ventricular systolic function is normal. The right ventricular  size is normal. Tricuspid regurgitation signal is inadequate for assessing  PA pressure.   5. Left atrial size was mildly dilated.   6. The mitral valve is normal in structure. Mild mitral valve  regurgitation. No evidence of mitral stenosis.   Comparison(s): A prior study was performed on 09/13/2019. No significant change from prior study. Prior images reviewed side by side. This study may underestimate gradient severity (prior mean gradient of 30 mmHg seen in A3c view).   EKG:  EKG ordered today and personally reviewed: Normal sinus rhythm, rate 66 bpm, with  non-specific ST/T changes.   Recent Labs: 09/21/2020: BUN 16; Creatinine, Ser 0.92; Potassium 3.7; Sodium 139  Recent Lipid Panel    Component Value Date/Time   CHOL 128 08/18/2017 0247   TRIG 52 08/18/2017 0247   HDL 69 08/18/2017 0247   CHOLHDL 1.9 08/18/2017 0247   VLDL 10 08/18/2017 0247   LDLCALC 49 08/18/2017 0247    Physical Exam:    Vital Signs: BP (!) 150/74 (BP Location: Left Arm, Patient Position: Sitting, Cuff Size: Large)   Pulse 78   Wt 189 lb 6.4 oz (85.9 kg)   SpO2 96%   BMI 28.80 kg/m     Wt Readings from Last 3 Encounters:  07/23/21 189 lb 6.4 oz (85.9 kg)  06/17/21 180 lb 3.2 oz (81.7 kg)  03/09/21 181 lb (82.1 kg)     General: 80 y.o. Caucasian female in no acute distress. Nonverbal. HEENT: Normocephalic and atraumatic. Sclera clear. EOMs intact. Neck: Supple. Bilateral carotid bruits noted (suspect radiation from AS murmur). No JVD. Heart: RRR. Distinct S1 and S2. III/VI systolic murmur. No rubs or gallops. Radial pulses 2+ and equal bilaterally. Lungs: No increased work of breathing. Clear to  ausculation bilaterally. No wheezes, rhonchi, or rales.  Abdomen: Soft, non-distended, and non-tender to palpation.  Extremities: Mild lower extremity bilateral edema bilaterally. Skin: Warm and dry. Neuro: Alert but nonverbal. Left pupil initially smaller than right after syncopal/seizure event but then improved. No focal deficits. Psych: Normal affect. Nonverbal.  Assessment:    1. Syncope and collapse   2. Aortic valve stenosis, etiology of cardiac valve disease unspecified   3. Primary hypertension   4. Hyperlipidemia, unspecified hyperlipidemia type     Plan:    Syncope/Possible Seizure Patient presented to today to discuss recent Echo results which was ordered for further evaluation of shortness of breath.  Echo showed LVEF of 65 to 70% with normal wall motion and mild to moderate aortic stenosis.  Shortly after visit started patient began gazing off start something over and then became unresponsive.  She never lost a pulse or stopped breathing.  BP after this event had dropped to 88/45 (initially 150/74 on arrival to the office).  Blood glucose 122. EKG showed normal sinus rhythm, rate 66 bpm, with non-specific EKG changes.  She appeared to have a bowel movement around this time.  Unclear whether she had a vasovagal event following the bowel movement or had a seizure-like event and then lost control of her bowels.  Patient is nonverbal due to frontotemporal dementia though it it is very hard to get a history.  Therefore, will send patient to the ED for further evaluation. Likely needs a head CT. She does follow with Neurology as an outpatient.  EMS was called and will transport patient to [facility], ED. Of note, BP improved to low 100s/60s prior to leaving but heart rates had mildly dropped to the mid 50s.  Aortic Stenosis Recent Echo showed mild to moderate AS. Will continue to monitor with serial Echos.   Hypertension BP initially 150/74. However, then dropped to 88/45 after above  event. On Lisinopril 20mg  daily and Chlorthalidone 25mg  daily at home and she did take these medications this morning. Medications may need to be adjusted.  Hyperlipidemia Continue Crestor.  Disposition: Follow up after ED visit.   Medication Adjustments/Labs and Tests Ordered: Current medicines are reviewed at length with the patient today.  Concerns regarding medicines are outlined above.  No orders of the defined types were placed in  this encounter.  No orders of the defined types were placed in this encounter.   Patient Instructions  CALLED EMS SENT [facility] AND DEMOGRAPHICS   Signed, Smitty Knudsen  07/23/2021 9:45 AM    [facility]

## 2021-07-22 ENCOUNTER — Ambulatory Visit: Payer: Medicare PPO | Admitting: Internal Medicine

## 2021-07-22 ENCOUNTER — Encounter: Payer: Self-pay | Admitting: Student

## 2021-07-23 ENCOUNTER — Telehealth: Payer: Self-pay | Admitting: Physician Assistant

## 2021-07-23 ENCOUNTER — Emergency Department (HOSPITAL_COMMUNITY)
Admission: EM | Admit: 2021-07-23 | Discharge: 2021-07-23 | Disposition: A | Discharge status: Home / Self Care | Payer: Medicare PPO | Attending: Emergency Medicine | Admitting: Emergency Medicine

## 2021-07-23 ENCOUNTER — Emergency Department (HOSPITAL_COMMUNITY): Payer: Medicare PPO

## 2021-07-23 ENCOUNTER — Encounter: Payer: Self-pay | Admitting: Student

## 2021-07-23 ENCOUNTER — Other Ambulatory Visit: Payer: Self-pay

## 2021-07-23 ENCOUNTER — Ambulatory Visit: Payer: Medicare PPO | Admitting: Student

## 2021-07-23 VITALS — BP 150/74 | HR 78 | Wt 189.4 lb

## 2021-07-23 DIAGNOSIS — R0902 Hypoxemia: Secondary | ICD-10-CM | POA: Diagnosis not present

## 2021-07-23 DIAGNOSIS — I959 Hypotension, unspecified: Secondary | ICD-10-CM | POA: Diagnosis not present

## 2021-07-23 DIAGNOSIS — I350 Nonrheumatic aortic (valve) stenosis: Secondary | ICD-10-CM | POA: Diagnosis not present

## 2021-07-23 DIAGNOSIS — I10 Essential (primary) hypertension: Secondary | ICD-10-CM | POA: Diagnosis not present

## 2021-07-23 DIAGNOSIS — E785 Hyperlipidemia, unspecified: Secondary | ICD-10-CM | POA: Diagnosis not present

## 2021-07-23 DIAGNOSIS — E039 Hypothyroidism, unspecified: Secondary | ICD-10-CM | POA: Insufficient documentation

## 2021-07-23 DIAGNOSIS — Z20822 Contact with and (suspected) exposure to covid-19: Secondary | ICD-10-CM | POA: Diagnosis not present

## 2021-07-23 DIAGNOSIS — Z79899 Other long term (current) drug therapy: Secondary | ICD-10-CM | POA: Insufficient documentation

## 2021-07-23 DIAGNOSIS — R55 Syncope and collapse: Secondary | ICD-10-CM

## 2021-07-23 DIAGNOSIS — R011 Cardiac murmur, unspecified: Secondary | ICD-10-CM | POA: Insufficient documentation

## 2021-07-23 DIAGNOSIS — R519 Headache, unspecified: Secondary | ICD-10-CM | POA: Diagnosis not present

## 2021-07-23 DIAGNOSIS — R001 Bradycardia, unspecified: Secondary | ICD-10-CM | POA: Diagnosis not present

## 2021-07-23 DIAGNOSIS — G319 Degenerative disease of nervous system, unspecified: Secondary | ICD-10-CM | POA: Diagnosis not present

## 2021-07-23 DIAGNOSIS — R404 Transient alteration of awareness: Secondary | ICD-10-CM | POA: Diagnosis not present

## 2021-07-23 LAB — CBC WITH DIFFERENTIAL/PLATELET
Abs Immature Granulocytes: 0.01 10*3/uL (ref 0.00–0.07)
Basophils Absolute: 0.1 10*3/uL (ref 0.0–0.1)
Basophils Relative: 1 %
Eosinophils Absolute: 0.2 10*3/uL (ref 0.0–0.5)
Eosinophils Relative: 4 %
HCT: 35.8 % — ABNORMAL LOW (ref 36.0–46.0)
Hemoglobin: 11.8 g/dL — ABNORMAL LOW (ref 12.0–15.0)
Immature Granulocytes: 0 %
Lymphocytes Relative: 20 %
Lymphs Abs: 1 10*3/uL (ref 0.7–4.0)
MCH: 32.4 pg (ref 26.0–34.0)
MCHC: 33 g/dL (ref 30.0–36.0)
MCV: 98.4 fL (ref 80.0–100.0)
Monocytes Absolute: 0.3 10*3/uL (ref 0.1–1.0)
Monocytes Relative: 7 %
Neutro Abs: 3.5 10*3/uL (ref 1.7–7.7)
Neutrophils Relative %: 68 %
Platelets: 168 10*3/uL (ref 150–400)
RBC: 3.64 MIL/uL — ABNORMAL LOW (ref 3.87–5.11)
RDW: 12.8 % (ref 11.5–15.5)
WBC: 5.1 10*3/uL (ref 4.0–10.5)
nRBC: 0 % (ref 0.0–0.2)

## 2021-07-23 LAB — BASIC METABOLIC PANEL
Anion gap: 10 (ref 5–15)
BUN: 18 mg/dL (ref 8–23)
CO2: 25 mmol/L (ref 22–32)
Calcium: 9.5 mg/dL (ref 8.9–10.3)
Chloride: 100 mmol/L (ref 98–111)
Creatinine, Ser: 1.41 mg/dL — ABNORMAL HIGH (ref 0.44–1.00)
GFR, Estimated: 37 mL/min — ABNORMAL LOW (ref >60)
Glucose, Bld: 138 mg/dL — ABNORMAL HIGH (ref 70–99)
Potassium: 3.6 mmol/L (ref 3.5–5.1)
Sodium: 135 mmol/L (ref 135–145)

## 2021-07-23 LAB — RESP PANEL BY RT-PCR (FLU A&B, COVID) ARPGX2
Influenza A by PCR: NEGATIVE
Influenza B by PCR: NEGATIVE
SARS Coronavirus 2 by RT PCR: NEGATIVE

## 2021-07-23 LAB — URINALYSIS, ROUTINE W REFLEX MICROSCOPIC
Bilirubin Urine: NEGATIVE
Glucose, UA: NEGATIVE mg/dL
Ketones, ur: NEGATIVE mg/dL
Nitrite: NEGATIVE
Protein, ur: NEGATIVE mg/dL
Specific Gravity, Urine: 1.009 (ref 1.005–1.030)
pH: 6 (ref 5.0–8.0)

## 2021-07-23 LAB — TSH: TSH: 10.144 u[IU]/mL — ABNORMAL HIGH (ref 0.350–4.500)

## 2021-07-23 LAB — MAGNESIUM: Magnesium: 2.2 mg/dL (ref 1.7–2.4)

## 2021-07-23 LAB — TROPONIN I (HIGH SENSITIVITY)
Troponin I (High Sensitivity): 10 ng/L (ref <18)
Troponin I (High Sensitivity): 12 ng/L (ref <18)

## 2021-07-23 NOTE — ED Notes (Signed)
Per pt's daughter, pt is back to baseline.

## 2021-07-23 NOTE — Addendum Note (Signed)
Addended by: Alyson Ingles on: 07/23/2021 12:22 PM   Modules accepted: Orders

## 2021-07-23 NOTE — ED Triage Notes (Signed)
Patient sent to [facility] from a cardiologists office for evaluation of a syncopal episode lasting approximately 5 minutes in the office, BP 80/60 during episode. Reports history of similar events several years ago when taking a beta blocker but is no longer taking that medication. Patient alert, oriented at baseline, history of speech deficits from vascular dementia. 20g saline lock in right AC.

## 2021-07-23 NOTE — Discharge Instructions (Addendum)
The urine showed some white cells but only few bacteria.  You will be notified if it grows out an infection later.  Talk to neurology to see if the abnormal MRI findings would necessitate medication changes.

## 2021-07-23 NOTE — ED Notes (Signed)
Pt provided discharge instructions and prescription information. Pt was given the opportunity to ask questions and questions were answered. Discharge signature not obtained in the setting of the COVID-19 pandemic in order to reduce high touch surfaces.  ° °

## 2021-07-23 NOTE — Progress Notes (Signed)
Pt brought to MRI for exam via pt transport. Pt safety screened via daughter, prepared for exam and began scanning. Midway through exam pt began stating "no, no, I can't do it" Pt removed from scanner and advised of time remaining of exam, pt again stated they could not do it. Pt refused to continue. Pt transferred back to stretcher and sent back to ED via pt transport. Limited study, able to obtain AX DWI, COR DWI, SAG T1, and AX T2 FLAIR.

## 2021-07-23 NOTE — ED Provider Notes (Signed)
[facility] EMERGENCY DEPARTMENT Provider Note   CSN: [id number] Arrival date & time: 07/23/21  1005     History Chief Complaint  Patient presents with   Loss of Consciousness    Adrienne Jackson is a 80 y.o. female.  The history is provided by the patient, a relative and medical records. The history is limited by the condition of the patient.  Loss of Consciousness Episode history:  Single Most recent episode:  Today Duration:  5 minutes Timing:  Constant Progression:  Resolved Chronicity:  Recurrent Witnessed: yes   Relieved by:  Nothing Worsened by:  Nothing Ineffective treatments:  None tried Associated symptoms: no chest pain, no confusion, no diaphoresis, no dizziness, no fever, no headaches, no malaise/fatigue, no nausea, no rectal bleeding, no shortness of breath, no vomiting and no weakness   Risk factors: no seizures       Past Medical History:  Diagnosis Date   Aortic stenosis    Hyperlipidemia    Hypertension    Hypothyroidism    Syncope 08/18/2017    Patient Active Problem List   Diagnosis Date Noted   Vitamin D deficiency 05/07/2021   Osteoporosis 09/21/2020   Hypothyroidism    Hypertension    Hyperlipidemia    Language impairment 08/18/2017   Cognitive impairment 08/18/2017   Pseudogout of right wrist 08/18/2017   Undiagnosed cardiac murmurs 08/18/2017   Abnormal brain MRI 08/18/2017   Essential hypertension 08/18/2017   Syncope and collapse 08/18/2017   Syncope 08/18/2017   Aphasia    Confusion    Moderate aortic stenosis    Transient neurologic deficit 08/17/2017    Past Surgical History:  Procedure Laterality Date   ABDOMINAL HYSTERECTOMY       OB History   No obstetric history on file.     Family History  Problem Relation Age of Onset   Heart disease Father     Social History   Tobacco Use   Smoking status: Never   Smokeless tobacco: Never  Vaping Use   Vaping Use: Never used  Substance Use  Topics   Alcohol use: No   Drug use: No    Home Medications Prior to Admission medications   Medication Sig Start Date End Date Taking? Authorizing Provider  chlorthalidone (HYGROTON) 25 MG tablet 1 tablet in the morning with food 06/17/21   Parke Poisson, MD  Cholecalciferol (VITAMIN D) 50 MCG (2000 UT) tablet 1 tablet    [provider]  escitalopram (LEXAPRO) 20 MG tablet Taking 1.5 tablets daily. 03/09/21   Marcos Eke, PA-C  ferrous sulfate 324 MG TBEC Take 324 mg by mouth.    [provider]  levothyroxine (SYNTHROID) 100 MCG tablet 1 tablet in the morning on an empty stomach    [provider]  lisinopril (ZESTRIL) 20 MG tablet Take 1 tablet (20 mg total) by mouth daily. 06/24/21   Parke Poisson, MD  Multiple Vitamins-Minerals (CENTRUM SILVER ADULT 50+ PO) Take by mouth daily.    [provider]  mupirocin ointment (BACTROBAN) 2 % Apply to left foot once daily until healed. 12/04/20   Freddie Breech, DPM  Oyster Shell (OYSTER CALCIUM) 500 MG TABS tablet Take 500 mg of elemental calcium by mouth 2 (two) times daily.     [provider]  rosuvastatin (CRESTOR) 20 MG tablet Take 1 tablet (20 mg total) by mouth daily. 06/17/21   Parke Poisson, MD  zoledronic acid (RECLAST) 5 MG/100ML SOLN injection  See admin instructions.    [provider]    Allergies    Patient has no known allergies.  Review of Systems   Review of Systems  Unable to perform ROS: Dementia  Constitutional:  Negative for chills, diaphoresis, fever and malaise/fatigue.  HENT:  Negative for congestion.   Respiratory:  Negative for cough, chest tightness and shortness of breath.   Cardiovascular:  Positive for syncope. Negative for chest pain.  Gastrointestinal:  Negative for abdominal pain, constipation, diarrhea, nausea and vomiting.  Genitourinary:  Negative for dysuria and flank pain.  Musculoskeletal:  Negative for back pain.  Skin:   Negative for rash and wound.  Neurological:  Positive for syncope. Negative for dizziness, weakness and headaches.  Psychiatric/Behavioral:  Negative for confusion.   All other systems reviewed and are negative.  Physical Exam Updated Vital Signs BP 125/61 (BP Location: Left Arm)   Pulse 61   Temp 97.6 F (36.4 C) (Oral)   Resp 18   SpO2 99%   Physical Exam Vitals and nursing note reviewed.  Constitutional:      General: She is not in acute distress.    Appearance: She is well-developed. She is not ill-appearing, toxic-appearing or diaphoretic.  HENT:     Head: Normocephalic and atraumatic.     Mouth/Throat:     Mouth: Mucous membranes are dry.     Pharynx: No oropharyngeal exudate or posterior oropharyngeal erythema.  Eyes:     Extraocular Movements: Extraocular movements intact.     Conjunctiva/sclera: Conjunctivae normal.     Pupils: Pupils are equal, round, and reactive to light.  Cardiovascular:     Rate and Rhythm: Normal rate and regular rhythm.     Heart sounds: Murmur heard.  Pulmonary:     Effort: Pulmonary effort is normal. No respiratory distress.     Breath sounds: Normal breath sounds. No wheezing, rhonchi or rales.  Chest:     Chest wall: No tenderness.  Abdominal:     Palpations: Abdomen is soft.     Tenderness: There is no abdominal tenderness. There is no right CVA tenderness, left CVA tenderness, guarding or rebound.  Musculoskeletal:        General: No tenderness.     Cervical back: Neck supple.  Skin:    General: Skin is warm and dry.     Capillary Refill: Capillary refill takes less than 2 seconds.     Findings: No erythema.  Neurological:     General: No focal deficit present.     Mental Status: She is alert.     Sensory: No sensory deficit.     Motor: No weakness.  Psychiatric:        Mood and Affect: Mood normal.    ED Results / Procedures / Treatments   Labs (all labs ordered are listed, but only abnormal results are displayed) Labs  Reviewed  BASIC METABOLIC PANEL - Abnormal; Notable for the following components:      Result Value   Glucose, Bld 138 (*)    Creatinine, Ser 1.41 (*)    GFR, Estimated 37 (*)    All other components within normal limits  CBC WITH DIFFERENTIAL/PLATELET - Abnormal; Notable for the following components:   RBC 3.64 (*)    Hemoglobin 11.8 (*)    HCT 35.8 (*)    All other components within normal limits  TSH - Abnormal; Notable for the following components:   TSH 10.144 (*)    All other components within normal  limits  RESP PANEL BY RT-PCR (FLU A&B, COVID) ARPGX2  MAGNESIUM  URINALYSIS, ROUTINE W REFLEX MICROSCOPIC  CBG MONITORING, ED  TROPONIN I (HIGH SENSITIVITY)  TROPONIN I (HIGH SENSITIVITY)    EKG EKG Interpretation  Date/Time:  Friday July 23 2021 12:41:50 EDT Ventricular Rate:  57 PR Interval:  173 QRS Duration: 103 QT Interval:  480 QTC Calculation: 468 R Axis:   65 Text Interpretation: Sinus rhythm When compared to prior, similar appearance. No STEMI Confirmed by Theda Belfast ([id number]) on 07/23/2021 2:13:09 PM  Radiology CT HEAD WO CONTRAST  Result Date: 07/23/2021 CLINICAL DATA:  80 year old female with history of mental status change. Syncope. Headache. EXAM: CT HEAD WITHOUT CONTRAST TECHNIQUE: Contiguous axial images were obtained from the base of the skull through the vertex without intravenous contrast. COMPARISON:  CT angio head neck 08/17/2017. FINDINGS: Brain: Mild cerebral atrophy. Patchy areas of decreased attenuation are noted throughout the deep and periventricular white matter of the cerebral hemispheres bilaterally, compatible with mild chronic microvascular ischemic disease. Physiologic calcifications in the basal ganglia. No evidence of acute infarction, hemorrhage, hydrocephalus, extra-axial collection or mass lesion/mass effect. Vascular: No hyperdense vessel or unexpected calcification. Skull: Normal. Negative for fracture or focal lesion.  Sinuses/Orbits: No acute finding. Other: None. IMPRESSION: 1. No acute intracranial abnormalities. 2. Mild cerebral atrophy with mild chronic microvascular ischemic changes in the cerebral white matter, as above. Electronically Signed   By: Trudie Reed M.D.   On: 07/23/2021 11:58    Procedures Procedures   Medications Ordered in ED Medications - No data to display  ED Course  I have reviewed the triage vital signs and the nursing notes.  Pertinent labs & imaging results that were available during my care of the patient were reviewed by me and considered in my medical decision making (see chart for details).    MDM Rules/Calculators/A&P                           Derhonda Eastlick is a 80 y.o. 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  According to daughter who is able to answer most questions, patient was having a follow-up visit for echo report after some recent shortness of breath.  He reports he was doing better and has not had recent shortness of breath but while in the doctor's office, pointed to her forehead and slumped over.  Initial blood pressure was found to be 80/60 during the episode before she started coming to about 5 minutes later.  The cardiology team and family noticed that her left pupil was smaller than the right.  By the time she was brought in, that has resolved.  Patient is now back to her baseline in regards to mental status, speech, and pupil exam.  Patient also had a bowel movement just after the episode.  She also reportedly had some symmetric bilateral hand shaking during the episode.  Daughter reports that she had this several years ago and had a work-up including admission for EEG monitoring that was all negative.  They do not feel she has seizures  based on that work-up before.  Otherwise, family reports she has not had recent fevers, chills congestion, cough, nausea, vomiting, constipation, diarrhea, or urinary changes.  No recent trauma otherwise.  On my exam, lungs are clear and chest is  nontender.  Abdomen is nontender.  Patient has intact and symmetric sensation and strength in extremities.  She did have some difficulty doing finger-nose-finger testing but was able to complete it.  Pupils were symmetric with normal extraocular movements on my exam.  Symmetric smile.  No tenderness on exam.  Clinically I suspect she had a syncopal episode with the hypotension leading to the episode of slumping over and minimal responsiveness.  I suspect there were some convulsive features with the hypotension that may have also contributed to the pupil difference.  Family suspect that the bowel movement took place after the episode and was not the instigating cause with a vagal episode however they are concerned about what happened.  Patient had a CT on arrival that did not show evidence of bleed.  We will get MRI to look for stroke given the transient pupil difference and we will get other work-up for the syncope.  If her neurologic imaging is reassuring, suspect this is more syncopal and will touch base with cardiology whom she was visiting.  I anticipate due to the hypotension and syncope, patient may need admission or further monitoring.  Anticipate reassessment after work-up is completed.  Care transferred to oncoming team to await results of MRI and reassessment.  Given the episode of hypotension and syncope and AKI discovered, suspect patient may be appropriate for admission.   Final Clinical Impression(s) / ED Diagnoses Final diagnoses:  Syncope, unspecified syncope type      Clinical Impression: 1. Syncope, unspecified syncope type     Disposition: Care transferred to oncoming team to await results of MRI and reassessment.  Given the  episode of hypotension and syncope and AKI discovered, suspect patient may be appropriate for admission.  This note was prepared with assistance of Conservation officer, historic buildings. Occasional wrong-word or sound-a-like substitutions may have occurred due to the inherent limitations of voice recognition software.     Keren Alverio, Canary Brim, MD 07/23/21 1540

## 2021-07-23 NOTE — ED Provider Notes (Signed)
Emergency Medicine Provider Triage Evaluation Note  Adrienne Jackson , a 80 y.o. female  was evaluated in triage.  Pt complains of syncope. Patient sent here from cardiology via EMS after she had what sounds to be a syncopal episode vs. Seizure at appointment. Patient has frontotemporal dementia with expressive aphasia. Unable to obtain history from patient. Per Cardiology note, patient was starting appointment when she had an episode of "gazing off" and then became "unresponsive." She never lost a pulse or stopped breathing. Her BP after event was 80s/60s. Blood glucose 122. Had episode of bowel incontinence.   Review of Systems  Positive: Syncope  Negative: Focal deficit   Physical Exam  BP 125/61 (BP Location: Left Arm)   Pulse 61   Temp 97.6 F (36.4 C) (Oral)   Resp 18   SpO2 99%  Gen:   Awake, no distress   Resp:  Normal effort  MSK:   Moves extremities without difficulty  Other:  No focal neurological deficits. Unclear of patient baseline, however has expressive aphasia and unable to obtain orientation. Cranial nerves intact. No pronator drift.   Medical Decision Making  Medically screening exam initiated at 10:38 AM.  Appropriate orders placed.  Thanh Pomerleau was informed that the remainder of the evaluation will be completed by another provider, this initial triage assessment does not replace that evaluation, and the importance of remaining in the ED until their evaluation is complete.     Cristopher Peru, PA-C 07/23/21 1042    Ernie Avena, MD 07/23/21 1047

## 2021-07-23 NOTE — Patient Instructions (Signed)
CALLED EMS SENT W/EKG AND DEMOGRAPHICS

## 2021-07-23 NOTE — Telephone Encounter (Signed)
Pt's daughter called in stating the patient had a seizure like episode while at a doctor's visit. They sent her to the ED, she is there now.

## 2021-07-23 NOTE — ED Notes (Signed)
Got patient undressed on the monitor patient is resting with family at bedside and call bell in reach  

## 2021-07-23 NOTE — ED Notes (Signed)
Pt's daughter reports L pupil smaller during the syncopal episode earlier today. Symmetrical at this time.

## 2021-07-23 NOTE — ED Notes (Signed)
Placed a external and a brief on patient she is resting with call bell in reach and family at bedside

## 2021-07-25 NOTE — Telephone Encounter (Signed)
Noted, ER notes reviewed. She had similar syncopal episodes in the past and had prolonged EEG, episodes felt related to fluctuating BP, which again happened this time. Pls let daughter know we reviewed the ER notes, no new tests needed from neurological standpoint. Thanks

## 2021-07-26 LAB — URINE CULTURE: Culture: >=100000 — AB

## 2021-07-26 NOTE — Telephone Encounter (Signed)
Advised and thanked me for calling. No need for any further testing at this time.

## 2021-07-27 ENCOUNTER — Telehealth: Payer: Self-pay

## 2021-07-27 NOTE — Telephone Encounter (Signed)
Post ED Visit - Positive Culture Follow-up  Culture report reviewed by antimicrobial stewardship pharmacist: [facility] [x]  , Pharm.D. []  Daylene Posey, Pharm.D., BCPS AQ-ID []  , Pharm.D., BCPS []  Celedonio Miyamoto, Pharm.D., BCPS []  [place], Garvin Fila.D., BCPS, AAHIVP []  , Pharm.D., BCPS, AAHIVP []  Georgina Pillion, PharmD, BCPS []  , PharmD, BCPS []  [place], PharmD, BCPS []  [address], PharmD []  , PharmD, BCPS []  Estella Husk, PharmD  Pharmacy Team []  Lysle Pearl, PharmD []  , PharmD []  Phillips Climes, PharmD []  , Rph []  Agapito Games) , PharmD []  Verlan Friends, PharmD []  , PharmD []  Mervyn Gay, PharmD []  , PharmD []  Vinnie Level, PharmD []  W[facility], PharmD []  , PharmD []  Len Childs, PharmD   Positive urine culture No urinary sx or symptoms of infection, no treatment necessary.  No further patient follow-up is required at this time.  07/27/2021, 11:26 AM

## 2021-08-11 ENCOUNTER — Ambulatory Visit (INDEPENDENT_AMBULATORY_CARE_PROVIDER_SITE_OTHER): Payer: Medicare PPO | Admitting: Podiatry

## 2021-08-11 ENCOUNTER — Other Ambulatory Visit: Payer: Self-pay

## 2021-08-11 DIAGNOSIS — L84 Corns and callosities: Secondary | ICD-10-CM

## 2021-08-11 DIAGNOSIS — F0280 Dementia in other diseases classified elsewhere without behavioral disturbance: Secondary | ICD-10-CM | POA: Insufficient documentation

## 2021-08-11 DIAGNOSIS — G3109 Other frontotemporal dementia: Secondary | ICD-10-CM | POA: Insufficient documentation

## 2021-08-11 DIAGNOSIS — I359 Nonrheumatic aortic valve disorder, unspecified: Secondary | ICD-10-CM | POA: Insufficient documentation

## 2021-08-11 DIAGNOSIS — M79671 Pain in right foot: Secondary | ICD-10-CM | POA: Diagnosis not present

## 2021-08-11 DIAGNOSIS — M79675 Pain in left toe(s): Secondary | ICD-10-CM

## 2021-08-11 DIAGNOSIS — M79672 Pain in left foot: Secondary | ICD-10-CM | POA: Diagnosis not present

## 2021-08-11 DIAGNOSIS — G4700 Insomnia, unspecified: Secondary | ICD-10-CM | POA: Insufficient documentation

## 2021-08-11 DIAGNOSIS — M79674 Pain in right toe(s): Secondary | ICD-10-CM | POA: Diagnosis not present

## 2021-08-11 DIAGNOSIS — B351 Tinea unguium: Secondary | ICD-10-CM | POA: Diagnosis not present

## 2021-08-11 DIAGNOSIS — F419 Anxiety disorder, unspecified: Secondary | ICD-10-CM | POA: Insufficient documentation

## 2021-08-15 ENCOUNTER — Encounter: Payer: Self-pay | Admitting: Podiatry

## 2021-08-15 NOTE — Progress Notes (Signed)
  Subjective:  Patient ID: Adrienne Jackson, female    DOB: 06-Jan-1941,  MRN: [id number]  Adrienne Jackson presents to clinic today for painful elongated mycotic toenails 1-5 bilaterally which are tender when wearing enclosed shoe gear. Pain is relieved with periodic professional debridement.  She has h/o dementia. Her daughter, Adrienne Jackson,  is present during today's visit.  [name] continues to live with her sister and they have help coming in during the day to assist them with their ADLs. [name] daughter is very involved in their day to day needs.  PCP is [facility], Fanny Dance, MD , and last visit was 07/24/2021.  No Known Allergies  Review of Systems: Negative except as noted in the HPI. Objective:   Constitutional Adrienne Jackson is a pleasant 80 y.o. Caucasian female, WD, WN in NAD. AAO x 3.   Vascular CFT immediate b/l LE. Palpable DP/PT pulses b/l LE. Digital hair sparse b/l. Skin temperature gradient WNL b/l. No pain with calf compression b/l. No edema noted b/l. No cyanosis or clubbing noted b/l LE. No cyanosis or clubbing noted.  Neurologic Normal speech. Oriented to person, place, and time. Protective sensation intact 5/5 intact bilaterally with 10g monofilament b/l. Vibratory sensation intact b/l.  Dermatologic Pedal integument with normal turgor, texture and tone b/l LE. No open wounds b/l. No interdigital macerations b/l. Toenails 1-5 b/l elongated, thickened, discolored with subungual debris. +Tenderness with dorsal palpation of nailplates. Hyperkeratotic lesion(s) noted R 2nd toe and R 3rd toe.  Orthopedic: Muscle strength 5/5 to all lower extremity muscle groups bilaterally. HAV with bunion deformity noted b/l LE.   Radiographs: None Assessment:   1. Pain due to onychomycosis of toenails of both feet   2. Corns   3. Pain in both feet    Plan:  Patient was evaluated and treated and all questions answered. Consent given for treatment as described  below: -Examined patient. -Medicare ABN on file for paring of corns/calluses 2-4. -Patient to continue soft, supportive shoe gear daily. -Mycotic toenails 1-5 bilaterally were debrided in length and girth with sterile nail nippers and dremel without incident. -Corn(s) R 2nd toe and R 3rd toe pared utilizing sterile scalpel blade without complication or incident. Total number debrided=2. -Patient/POA to call should there be question/concern in the interim.  Return in about 3 months (around 11/11/2021).  Freddie Breech, DPM

## 2021-09-09 NOTE — Progress Notes (Signed)
Assessment/Plan:   Frontotemporal Dementia without behavioral disturbance:     Discussed safety both in and out of the home. Continue 24/7 care Discussed the importance of regular daily schedule  Continue to monitor mood with Lexapro 30 mg daily  Stay active at least 30 minutes at least 3 times a week.   Follow up in 6 months.   Case discussed with Dr. Delice Lesch who agrees with the plan     Subjective:   Adrienne Jackson is a 80 y.o. female RH woman with a history of hypertension, hyperlipidemia, hypothyroidism, anxiety,  and a history of frontotemporal dementia last seen in office on 03/09/21. This patient is accompanied in the office by her daughter Lattie Haw who supplements the history as patient is unable to communicate.  Previous records as well as any outside records available were reviewed prior to todays visit.  Since her last visit, her daughter reports that she has seldom verbalization, and her level of comprehension is worse.  She has to show her basic directions.  Her long-term memory is also declining.  The patient lives with her sister, and a caregiver comes 5 times a week, 2 of those days at the longer amount of time to help around the house, and have longer interaction.  The rest of the day she comes for 1 hour to help her with showering, she is unable to do it on her own.  She no longer picks her clothes to dress up, she has hoarding behavior, and l has a tendency to want to wear the same clothes from prior day.  At times, she is placing 1 panty on top of the other ones.  She is incontinent of urine, and does not like to wear diapers.  The caregiver notices that the wipes are not being used.  Lattie Haw reports that she is needing Depends by now.  At times, the caregiver reports that she finds some pieces of feces on the bedspread.  There is more hoarding behavior, she likes to collect napkins. She no longer cooks has to be directed to eat.  No trouble swallowing is reported.  She likes  to have her coffee in the morning.  She sleeps fairly well, but she is going to try melatonin, as she wakes up in the middle of the night.  At times, she sleeps on the recliner, what she appears to be more comfortable.  She has some restlessness in her legs, but there is no REM behavior, hallucinations or paranoia.  Her sister has gotten an Manufacturing engineer because she is wheelchair-bound, but the patient still able to use the stairs of her own.  Medications are managed by her sister as well as a caregiver.  She ambulates without significant difficulty, she finished her physical therapy with good response.  She denies any recent COVID.  Of note, since her last visit she presented to the cardiologist office for a checkup, at which time she was reported to have a possible presyncope or syncope while sitting, then pointing to her head witnessed by her daughter, "hand started shaking, she had a blank stare, and she laid back ".  In the office, crash cart was brought.  Her blood pressure was noted to have been elevated.  "When she came out of it, her blood pressure normalized.  She was taken by medics to the ED, troponins were noted to be high, but the rest of the work-up was negative including a UA.  However, the cultures returned positive for E. coli  UTI.  No further recurrence of these episodes.  Of note, EEG was performed at our office, with negative results for seizures.  No apparent focal numbness, tingling or weakness, neck or back pain, or tremors.  No myoclonic jerks.     History on Initial Assessment 09/10/2018: This is a pleasant 80 year old right-handed woman with a history of hypertension, hyperlipidemia, hypothyroidism, presenting to establish care for dementia. Records from [place], [place] were reviewed. Her daughter reports that the diagnosis of frontotemporal dementia came about when she started having episodes of passing out and slumping over in May 2018. She initially saw neurologist Dr. Ellyn Hack and had a  normal 72-hour EEG. MRI brain showed global atrophic changes. It was also felt she had underlying dementia at that point. She had 2 episodes in one day last Thanksgiving 2018 where she had a CTA head and neck and another MRI brain which were unremarkable. She was started on Keppra for presumed seizures. She was evaluated by epileptologist Dr. Deborah Chalk and had inpatient vEEG monitoring which showed left hemisphere slowing, particularly involving the frontotemporal parietal region, no epileptiform discharges. MRI in April 2018 showed generalized atrophic changes significantly worse across left hemisphere. It was felt that EEG did not support diagnosis of epilepsy, and that syncopal events were related to BP fluctuations. Keppra was discontinued and Depakote started for mood, anxiety, and sleep. She underwent Neuropsychological testing in April 2019 with findings consistent with a likely diagnosis of the language variant of frontotemporal dementia.   Her daughter reports that she has not had any further syncopal episodes since BP medications were adjusted in November 2018. Over the past year, she has noticed more cognitive decline, needing to repeat instructions, difficulty with multi-step commands. She would get the wrong thing from the fridge. She was making fish last Christmas and could not understand the recipe card. She has more word-finding difficulties, causing more anxiety due to difficulty communicating. She is on Lexapro 10mg  daily without side effects. She stopped driving in May [id number] after driving evaluation recommended no further driving. She moved in with her sister living in [place] who manages her medications. She is independent with dressing and bathing. No paranoia or hallucinations. She denies any headaches, dizziness, vision changes, focal numbness/tingling/weakness, neck/back pain, bowel/bladder dysfunction, anosmia, or tremors. No further staring/unresponsive episodes per daughter. She denies any  olfactory/gustatory hallucinations, myoclonic jerks. No family history of dementia, no history of concussions. She drinks alcohol socially.     PREVIOUS MEDICATIONS: None  CURRENT MEDICATIONS:  Outpatient Encounter Medications as of 09/10/2021  Medication Sig   Cephalexin 500 MG tablet 1 tablet   chlorthalidone (HYGROTON) 25 MG tablet 1 tablet   Cholecalciferol (VITAMIN D) 50 MCG (2000 UT) tablet Take 2,000 Units by mouth daily.   Cholecalciferol (VITAMIN D-3) 25 MCG (1000 UT) CAPS 1 capsule   escitalopram (LEXAPRO) 20 MG tablet 1 and 1/2 tablet   ferrous sulfate 325 (65 FE) MG tablet 1 tablet   levothyroxine (SYNTHROID) 100 MCG tablet Take 100 mcg by mouth daily before breakfast.   lisinopril (ZESTRIL) 20 MG tablet Take 1 tablet (20 mg total) by mouth daily.   Multiple Vitamins-Iron (MULTIVITAMIN PLUS IRON ADULT) TABS 1 tablet   Multiple Vitamins-Minerals (MULTIVITAMIN ADULTS 50+) TABS 1 tablet   mupirocin ointment (BACTROBAN) 2 % Apply to left foot once daily until healed.   Omega-3 Fatty Acids (FISH OIL) 1000 MG CAPS Take 1,000 mg by mouth daily.   rosuvastatin (CRESTOR) 20 MG tablet Take 1 tablet by  mouth daily.   zoledronic acid (RECLAST) 5 MG/100ML SOLN injection Inject 5 mg into the vein once.   No facility-administered encounter medications on file as of 09/10/2021.     Objective:     PHYSICAL EXAMINATION:    VITALS:   Vitals:   09/10/21 0740  BP: (!) 149/69  Pulse: 73  Resp: 18  SpO2: 100%  Weight: 190 lb (86.2 kg)  Height: 5\' 8"  (1.727 m)     GEN:  The patient appears stated age and is in NAD. HEENT:  Normocephalic, atraumatic.   Neurological examination:  General: NAD, well-groomed, appears stated age. Orientation: The patient is alert.  Unable to communicate due to aphasia, unable to answer questions, although seems to comprehend basic ones.   Cranial nerves: There is good facial symmetry.The speech is not fluent due to expressive aphasia . No   dysarthria. Fund of knowledge is reduced,  Recent and remote memory are impaired. Attention and concentration are reduced.  Unable to name objects and repeat phrases.  Hearing is intact to conversational tone.  Able to follow commands although follows very slowly. Sensation: Sensation is intact to light touch throughout Motor: Strength is at least antigravity x4.  No flowsheet data found.   MMSE - Mini Mental State Exam 12/10/2019 04/19/2019 09/10/2018  Orientation to time 1 3 1   Orientation to Place 0 2 0  Registration 3 3 3   Attention/ Calculation 0 0 1  Recall 0 0 0  Language- name 2 objects 0 2 1  Language- repeat 0 0 1  Language- follow 3 step command 3 3 3   Language- read & follow direction 1 0 0  Write a sentence 0 0 1  Copy design 1 0 1  Total score 9 13 12       Movement examination: Tone: There is normal tone in the UE/LE Abnormal movements: Very mild right greater than left tremor.  No myoclonus.  No asterixis.   Coordination:  There is no decremation with RAM's. Normal finger to nose  Gait and Station: The patient has no difficulty arising out of a deep-seated chair without the use of the hands. The patient's stride length is good but cautious.  Gait is cautious and narrow.    CBC CBC Latest Ref Rng & Units 07/23/2021 08/19/2017 08/18/2017  WBC 4.0 - 10.5 K/uL 5.1 6.8 9.3  Hemoglobin 12.0 - 15.0 g/dL 11.8(L) 12.2 12.2  Hematocrit 36.0 - 46.0 % 35.8(L) 37.4 36.8  Platelets 150 - 400 K/uL 168 143(L) 134(L)     CMP Latest Ref Rng & Units 07/23/2021 09/21/2020 12/14/2017  Glucose 70 - 99 mg/dL 08/21/2017) 96 85  BUN 8 - 23 mg/dL 18 16 13   Creatinine 0.44 - 1.00 mg/dL 08/20/2017) 07/25/2021 09/23/2020  Sodium 135 - 145 mmol/L 135 139 140  Potassium 3.5 - 5.1 mmol/L 3.6 3.7 4.2  Chloride 98 - 111 mmol/L 100 100 99  CO2 22 - 32 mmol/L 25 26 25   Calcium 8.9 - 10.3 mg/dL 9.5 9.8 9.6       Total time spent on today's visit was 45 minutes, including both face-to-face time and  nonface-to-face time. Time included that spent on review of records (prior notes available to me/labs/imaging if pertinent), discussing treatment and goals, answering patient's questions and coordinating care.  Cc:  Rankins, 12/16/2017, MD 093(O, PA-C

## 2021-09-10 ENCOUNTER — Other Ambulatory Visit: Payer: Self-pay

## 2021-09-10 ENCOUNTER — Encounter: Payer: Self-pay | Admitting: Physician Assistant

## 2021-09-10 ENCOUNTER — Ambulatory Visit: Payer: Medicare PPO | Admitting: Physician Assistant

## 2021-09-10 VITALS — BP 149/69 | HR 73 | Resp 18 | Ht 68.0 in | Wt 190.0 lb

## 2021-09-10 DIAGNOSIS — G3109 Other frontotemporal dementia: Secondary | ICD-10-CM | POA: Diagnosis not present

## 2021-09-10 DIAGNOSIS — E039 Hypothyroidism, unspecified: Secondary | ICD-10-CM | POA: Diagnosis not present

## 2021-09-10 DIAGNOSIS — F0280 Dementia in other diseases classified elsewhere without behavioral disturbance: Secondary | ICD-10-CM | POA: Diagnosis not present

## 2021-09-10 DIAGNOSIS — D649 Anemia, unspecified: Secondary | ICD-10-CM | POA: Diagnosis not present

## 2021-09-10 DIAGNOSIS — R4701 Aphasia: Secondary | ICD-10-CM

## 2021-09-10 NOTE — Patient Instructions (Signed)
Great seeing you! Continue Lexapro 20mg : take 1.5 tablets daily  Follow-up in 6 months, call for any changes   FALL PRECAUTIONS: Be cautious when walking. Scan the area for obstacles that may increase the risk of trips and falls. When getting up in the mornings, sit up at the edge of the bed for a few minutes before getting out of bed. Consider elevating the bed at the head end to avoid drop of blood pressure when getting up. Walk always in a well-lit room (use night lights in the walls). Avoid area rugs or power cords from appliances in the middle of the walkways. Use a walker or a cane if necessary and consider physical therapy for balance exercise. Get your eyesight checked regularly.  HOME SAFETY: Consider the safety of the kitchen when operating appliances like stoves, microwave oven, and blender. Consider having supervision and share cooking responsibilities until no longer able to participate in those. Accidents with firearms and other hazards in the house should be identified and addressed as well.   ABILITY TO BE LEFT ALONE: If patient is unable to contact 911 operator, consider using LifeLine, or when the need is there, arrange for someone to stay with patients. Smoking is a fire hazard, consider supervision or cessation. Risk of wandering should be assessed by caregiver and if detected at any point, supervision and safe proof recommendations should be instituted.   RECOMMENDATIONS FOR ALL PATIENTS WITH MEMORY PROBLEMS: 1. Continue to exercise (Recommend 30 minutes of walking everyday, or 3 hours every week) 2. Increase social interactions - continue going to [place] and enjoy social gatherings with friends and family 3. Eat healthy, avoid fried foods and eat more fruits and vegetables 4. Maintain adequate blood pressure, blood sugar, and blood cholesterol level. Reducing the risk of stroke and cardiovascular disease also helps promoting better memory. 5. Avoid stressful situations. Live a  simple life and avoid aggravations. Organize your time and prepare for the next day in anticipation. 6. Sleep well, avoid any interruptions of sleep and avoid any distractions in the bedroom that may interfere with adequate sleep quality 7. Avoid sugar, avoid sweets as there is a strong link between excessive sugar intake, diabetes, and cognitive impairment The Mediterranean diet has been shown to help patients reduce the risk of progressive memory disorders and reduces cardiovascular risk. This includes eating fish, eat fruits and green leafy vegetables, nuts like almonds and hazelnuts, walnuts, and also use olive oil. Avoid fast foods and fried foods as much as possible. Avoid sweets and sugar as sugar use has been linked to worsening of memory function.  There is always a concern of gradual progression of memory problems. If this is the case, then we may need to adjust level of care according to patient needs. Support, both to the patient and caregiver, should then be put into place.

## 2021-09-26 ENCOUNTER — Encounter: Payer: Self-pay | Admitting: Physician Assistant

## 2021-09-28 ENCOUNTER — Other Ambulatory Visit: Payer: Self-pay

## 2021-09-28 MED ORDER — ESCITALOPRAM OXALATE 20 MG PO TABS: ORAL_TABLET | ORAL | 3 refills | Status: DC

## 2021-09-28 MED ORDER — CHLORTHALIDONE 25 MG PO TABS: ORAL_TABLET | ORAL | 0 refills | Status: AC

## 2021-09-28 MED ORDER — LISINOPRIL 20 MG PO TABS: 20.0000 mg | ORAL_TABLET | Freq: Every day | ORAL | 0 refills | Status: AC

## 2021-09-28 MED ORDER — ROSUVASTATIN CALCIUM 20 MG PO TABS: 20.0000 mg | ORAL_TABLET | Freq: Every day | ORAL | 0 refills | Status: AC

## 2021-10-06 ENCOUNTER — Other Ambulatory Visit: Payer: Self-pay | Admitting: Physician Assistant

## 2021-10-06 MED ORDER — ESCITALOPRAM OXALATE 20 MG PO TABS: ORAL_TABLET | ORAL | 3 refills | Status: DC

## 2021-10-06 NOTE — Telephone Encounter (Signed)
Message sent    We are very sorry  about your disappointment with the office not contacting after your episode that require you to be sent to the Emergency Room.  Our Office does not routinely contact patient in that situation but follow up at next appointment. You stated that you would be following up with primary Dr Luciana Axe. If your decision is not to follow up with [facility] in the future.  All cardiac medication will need to be managed by your primary. Jasmine December RN

## 2021-10-07 ENCOUNTER — Other Ambulatory Visit: Payer: Self-pay | Admitting: Physician Assistant

## 2021-10-07 NOTE — Progress Notes (Signed)
Frequency of Lexapro was missing in the Rx sig. This was corrected and sent to the Mail Order Pharmacy of choice

## 2021-10-07 NOTE — Telephone Encounter (Signed)
Burley Saver. Sorry, I have been out of town so I am just now seeing this. Please see Dr. Lupe Carney message below. If patient would like to continue to follow with [place], I can see her whenever I have an available spot since Dr. Jacques Navy is on maternity leave.  Thank you! Bir[place] Troia

## 2021-10-20 ENCOUNTER — Encounter: Payer: Self-pay | Admitting: Internal Medicine

## 2021-11-02 IMAGING — MR MR HEAD W/O CM
6 series · 48 of 48 positions shown · non-contrast
Comparison: Same-day noncontrast CT head, CT and MRI head
08/17/2017

CLINICAL DATA: Altered mental status, syncope versus seizure
episode a cardiology, reported difference in pupil size, now back to
baseline

EXAM:
MRI HEAD WITHOUT CONTRAST
TECHNIQUE: Multiplanar, multiecho pulse sequences of the brain and surrounding
structures were obtained without intravenous contrast.

[Series 3: DWI · axial · 3.0mm · 1.09mm/px · z∈[-79,+71]mm · 16 of 102 slices shown (1 of 4)]
[im 1/102]
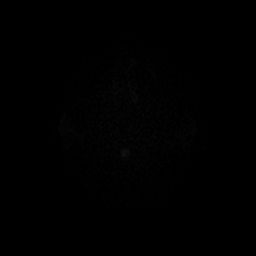
[im 7/102]
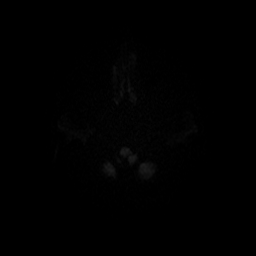
[im 14/102]
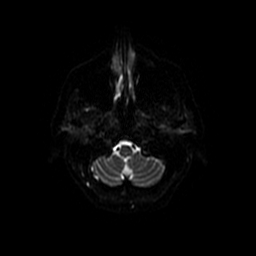
[im 21/102]
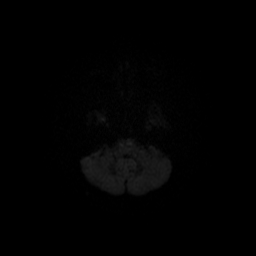
[im 27/102]
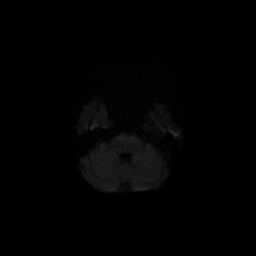
[im 34/102]
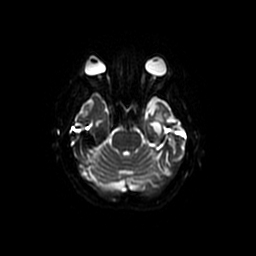
[im 41/102]
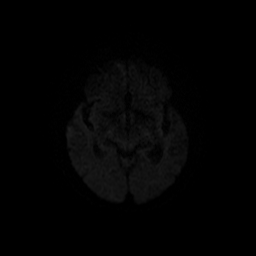
[im 48/102]
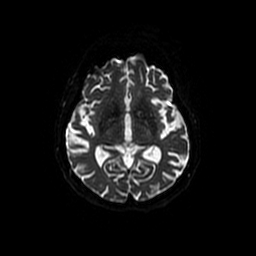
[im 54/102]
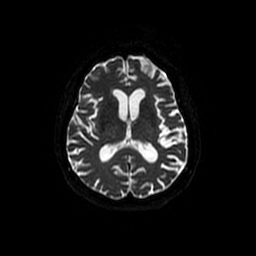
[im 61/102]
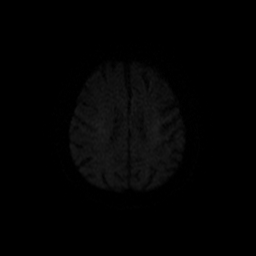
[im 68/102]
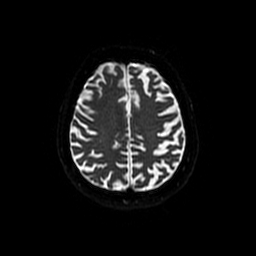
[im 75/102]
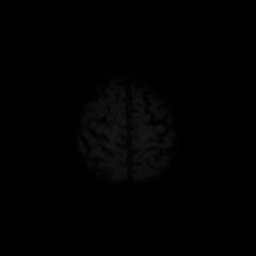
[im 81/102]
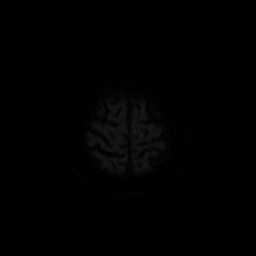
[im 88/102]
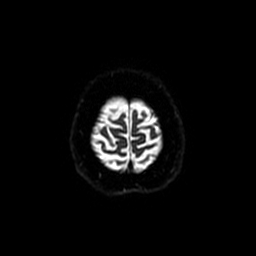
[im 95/102]
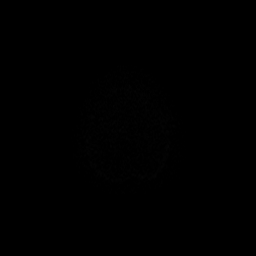
[im 102/102]
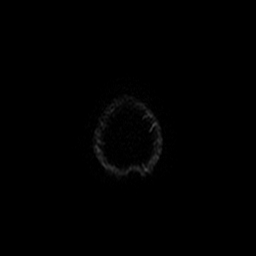

[Series 4: DWI · coronal · 5.0mm · 1.09mm/px · 12 of 72 slices shown (2 of 4)]
[im 1/72]
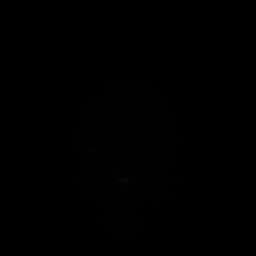
[im 7/72]
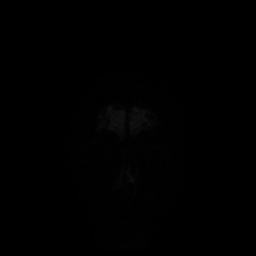
[im 13/72]
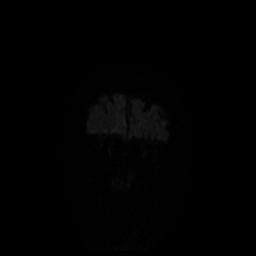
[im 20/72]
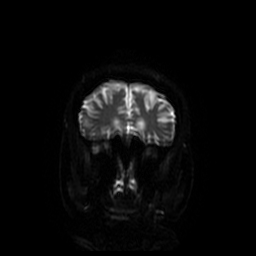
[im 26/72]
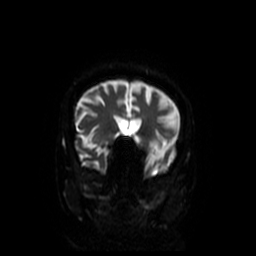
[im 33/72]
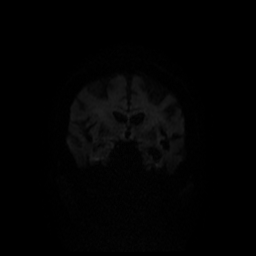
[im 39/72]
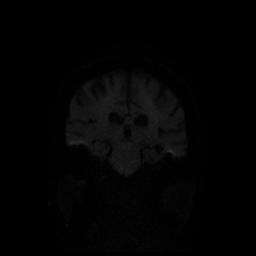
[im 46/72]
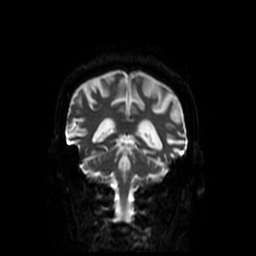
[im 52/72]
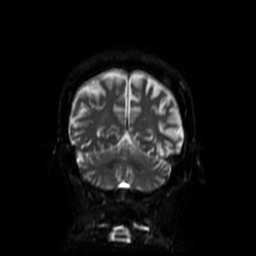
[im 59/72]
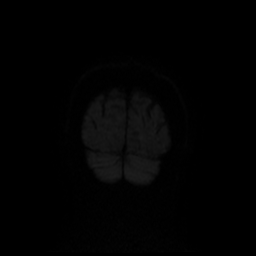
[im 65/72]
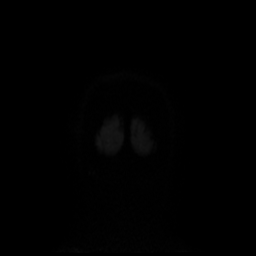
[im 72/72]
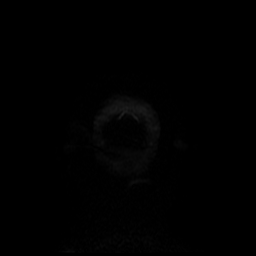

[Series 5: T1 · sagittal · 5.0mm · 0.47mm/px · 4 of 25 slices shown]
[im 1/25]
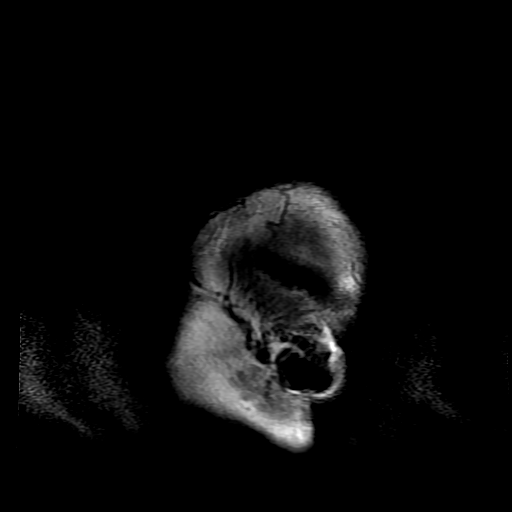
[im 9/25]
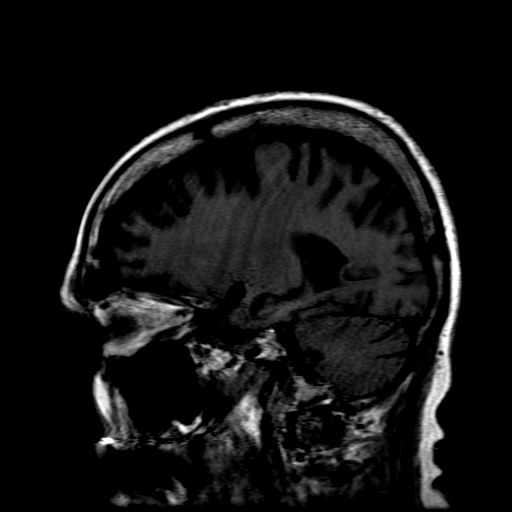
[im 17/25]
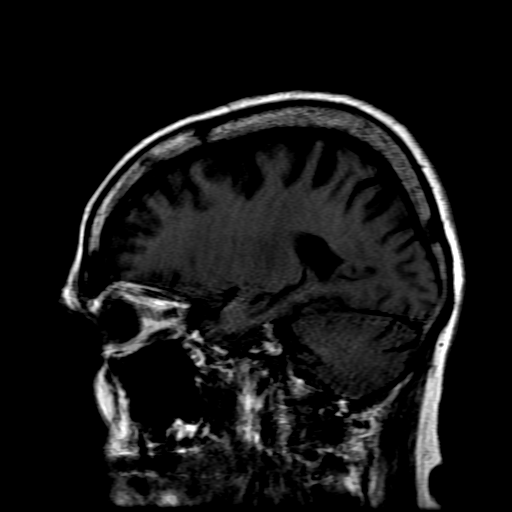
[im 25/25]
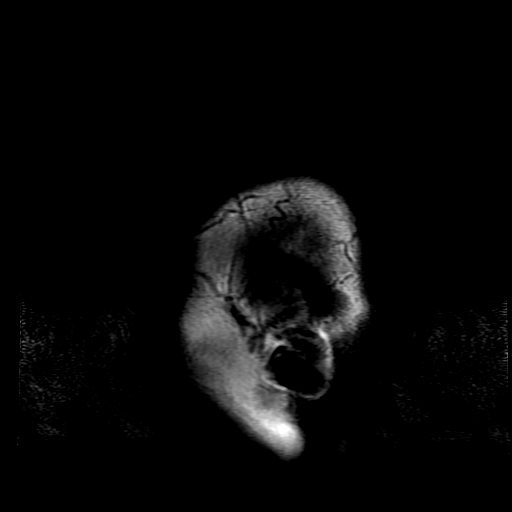

[Series 6: FLAIR · axial · 5.0mm · 0.43mm/px · z∈[-79,+65]mm · 2 of 13 slices shown]
[im 1/13]
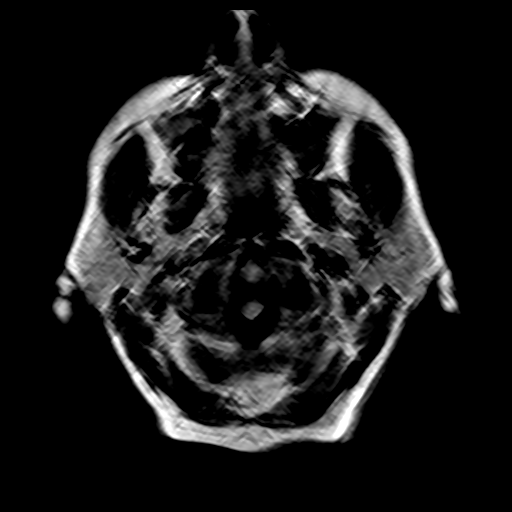
[im 13/13]
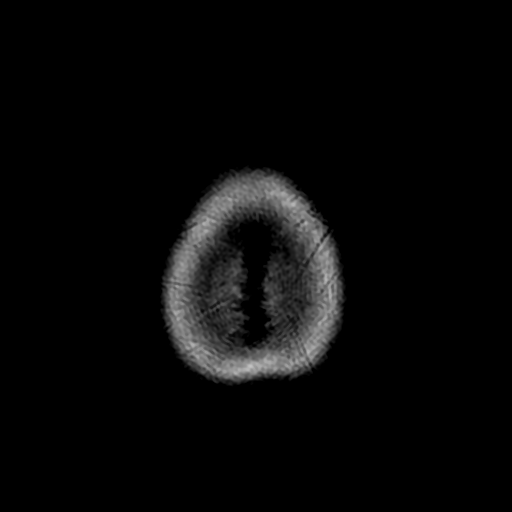

[Series 300: DWI · axial · 3.0mm · 1.09mm/px · z∈[-79,+71]mm · 8 of 51 slices shown (3 of 4)]
[im 1/51]
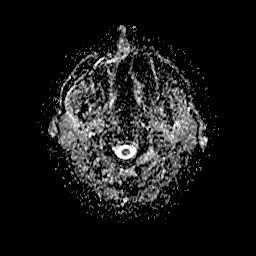
[im 8/51]
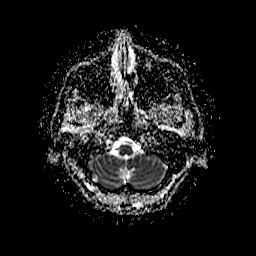
[im 15/51]
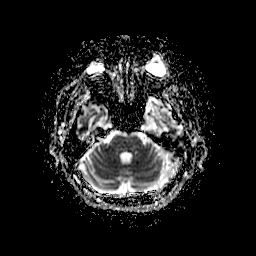
[im 22/51]
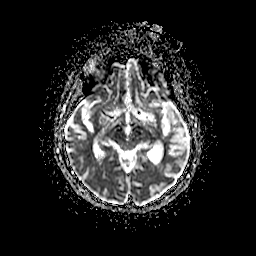
[im 29/51]
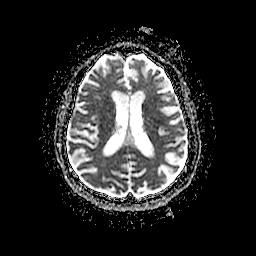
[im 36/51]
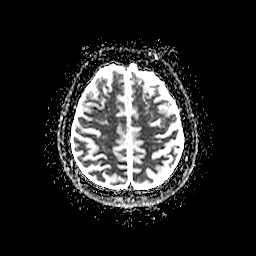
[im 43/51]
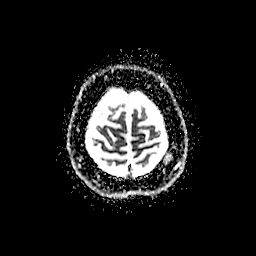
[im 51/51]
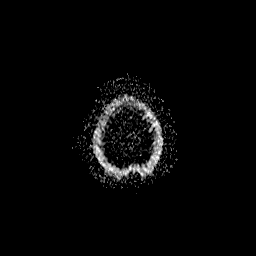

[Series 400: DWI · coronal · 5.0mm · 1.09mm/px · 6 of 36 slices shown (4 of 4)]
[im 1/36]
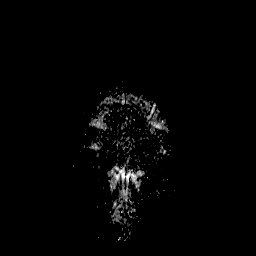
[im 8/36]
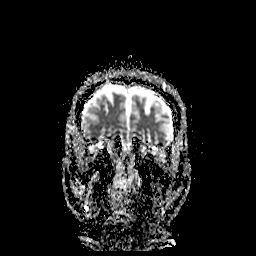
[im 15/36]
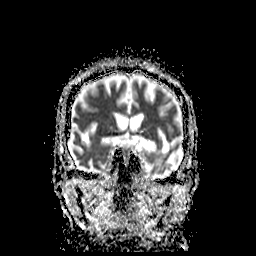
[im 22/36]
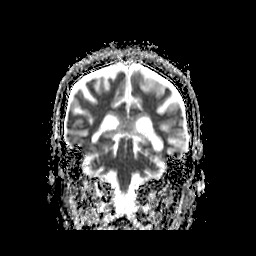
[im 29/36]
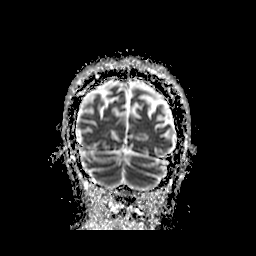
[im 36/36]
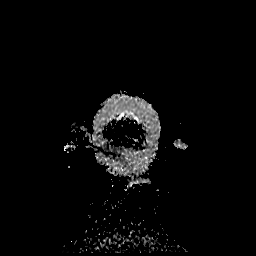

[48 of 48 positions shown; findings below may reference images not displayed]

FINDINGS: The study could not be completed. Axial DWI, coronal DWI, sagittal
T1, and axial FLAIR sequences are provided, and the obtained
sequences are motion degraded. Within this confine:

Brain: There is no evidence of acute intracranial hemorrhage,
extra-axial fluid collection, or acute infarct.

There is moderate global parenchymal volume loss with asymmetric
left hippocampal atrophy. The ventricles are stable in size. There
is a small area of encephalomalacia in the left temporal lobe. There
is no significant burden of chronic white matter microangiopathy. No
definite mass lesion is seen. There is no midline shift.

Vascular: Suboptimally assessed.

Skull and upper cervical spine: Normal marrow signal.

Sinuses/Orbits: Grossly unremarkable.

Other: None.
IMPRESSION: 1. The study could not be completed. Axial DWI, coronal DWI,
sagittal T1, and axial FLAIR sequences are provided, and the
obtained sequences are motion degraded. Within this confines:
2. No evidence of acute intracranial pathology. No diffusion
restriction to suggest acute infarct.
3. Moderate global parenchymal volume loss with asymmetric left
hippocampal atrophy.
4. Small area of encephalomalacia in the left temporal lobe
consistent with prior injury. This is new compared to the study from
08/17/2017.

## 2021-11-19 ENCOUNTER — Other Ambulatory Visit: Payer: Self-pay

## 2021-11-19 ENCOUNTER — Ambulatory Visit: Payer: Medicare HMO | Admitting: Podiatry

## 2021-11-19 ENCOUNTER — Encounter: Payer: Self-pay | Admitting: Podiatry

## 2021-11-19 DIAGNOSIS — E7800 Pure hypercholesterolemia, unspecified: Secondary | ICD-10-CM | POA: Diagnosis not present

## 2021-11-19 DIAGNOSIS — M79675 Pain in left toe(s): Secondary | ICD-10-CM

## 2021-11-19 DIAGNOSIS — M79674 Pain in right toe(s): Secondary | ICD-10-CM | POA: Diagnosis not present

## 2021-11-19 DIAGNOSIS — M79671 Pain in right foot: Secondary | ICD-10-CM

## 2021-11-19 DIAGNOSIS — E039 Hypothyroidism, unspecified: Secondary | ICD-10-CM | POA: Diagnosis not present

## 2021-11-19 DIAGNOSIS — L84 Corns and callosities: Secondary | ICD-10-CM

## 2021-11-19 DIAGNOSIS — L0390 Cellulitis, unspecified: Secondary | ICD-10-CM | POA: Diagnosis not present

## 2021-11-19 DIAGNOSIS — B351 Tinea unguium: Secondary | ICD-10-CM | POA: Diagnosis not present

## 2021-11-19 DIAGNOSIS — M79672 Pain in left foot: Secondary | ICD-10-CM | POA: Diagnosis not present

## 2021-11-19 DIAGNOSIS — I10 Essential (primary) hypertension: Secondary | ICD-10-CM | POA: Diagnosis not present

## 2021-11-19 DIAGNOSIS — I872 Venous insufficiency (chronic) (peripheral): Secondary | ICD-10-CM | POA: Diagnosis not present

## 2021-11-19 DIAGNOSIS — R69 Illness, unspecified: Secondary | ICD-10-CM | POA: Diagnosis not present

## 2021-11-19 DIAGNOSIS — R609 Edema, unspecified: Secondary | ICD-10-CM | POA: Diagnosis not present

## 2021-11-23 NOTE — Progress Notes (Signed)
°  Subjective:  Patient ID: Adrienne Jackson, female    DOB: Aug 09, 1941,  MRN: [id number]  Adrienne Jackson presents to clinic today for corn(s) right foot and painful thick toenails that are difficult to trim. Painful toenails interfere with ambulation. Aggravating factors include wearing enclosed shoe gear. Pain is relieved with periodic professional debridement. Painful corns are aggravated when weightbearing when wearing enclosed shoe gear. Pain is relieved with periodic professional debridement.  She has h/o dementia. Her daughter, Adrienne Jackson,  is present during today's visit.  Ms. Nielson continues to live with her sister and they have help coming in during the day to assist them with their ADLs. Ms. Golinski daughter is very involved in their day to day needs.  PCP is Rankins, Fanny Dance, MD , and she will be seen today 11/18/2021.  No Known Allergies  Review of Systems: Negative except as noted in the HPI. Objective:   Constitutional Adrienne Jackson is a pleasant 81 y.o. Caucasian female, WD, WN in NAD. AAO x 3.   Vascular CFT immediate b/l LE. Palpable DP/PT pulses b/l LE. Digital hair sparse b/l. Skin temperature gradient WNL b/l. No pain with calf compression b/l. No edema noted b/l. No cyanosis or clubbing noted b/l LE. No cyanosis or clubbing noted.  Neurologic Normal speech. Oriented to person, place, and time. Protective sensation intact 5/5 intact bilaterally with 10g monofilament b/l. Vibratory sensation intact b/l.  Dermatologic Pedal integument with normal turgor, texture and tone b/l LE. No open wounds b/l. No interdigital macerations b/l. Toenails 1-5 b/l elongated, thickened, discolored with subungual debris. +Tenderness with dorsal palpation of nailplates. Hyperkeratotic lesion(s) noted R 2nd toe and R 3rd toe.  Orthopedic: Muscle strength 5/5 to all lower extremity muscle groups bilaterally. HAV with bunion deformity noted b/l LE.   Radiographs:  None Assessment:   1. Pain due to onychomycosis of toenails of both feet   2. Corns   3. Pain in both feet    Plan:  Patient was evaluated and treated and all questions answered. Consent given for treatment as described below: -Medicare ABN on file for paring of corns/calluses 2-4. -Toenails 1-5 b/l were debrided in length and girth with sterile nail nippers and dremel without iatrogenic bleeding.  -Corn(s) R 2nd toe and R 3rd toe pared utilizing sterile scalpel blade without complication or incident. Total number debrided=2. -Patient/POA to call should there be question/concern in the interim.  Return in about 3 months (around 02/16/2022).  Freddie Breech, DPM

## 2021-11-24 ENCOUNTER — Ambulatory Visit: Payer: Medicare PPO | Admitting: Podiatry

## 2021-12-01 ENCOUNTER — Encounter: Payer: Self-pay | Admitting: Podiatry

## 2021-12-03 DIAGNOSIS — I872 Venous insufficiency (chronic) (peripheral): Secondary | ICD-10-CM | POA: Diagnosis not present

## 2021-12-03 DIAGNOSIS — M810 Age-related osteoporosis without current pathological fracture: Secondary | ICD-10-CM | POA: Diagnosis not present

## 2021-12-03 DIAGNOSIS — I10 Essential (primary) hypertension: Secondary | ICD-10-CM | POA: Diagnosis not present

## 2021-12-03 DIAGNOSIS — E7800 Pure hypercholesterolemia, unspecified: Secondary | ICD-10-CM | POA: Diagnosis not present

## 2021-12-03 DIAGNOSIS — L97811 Non-pressure chronic ulcer of other part of right lower leg limited to breakdown of skin: Secondary | ICD-10-CM | POA: Diagnosis not present

## 2021-12-03 DIAGNOSIS — L0390 Cellulitis, unspecified: Secondary | ICD-10-CM | POA: Diagnosis not present

## 2021-12-03 DIAGNOSIS — R69 Illness, unspecified: Secondary | ICD-10-CM | POA: Diagnosis not present

## 2021-12-03 DIAGNOSIS — G3109 Other frontotemporal dementia: Secondary | ICD-10-CM | POA: Diagnosis not present

## 2021-12-03 DIAGNOSIS — I350 Nonrheumatic aortic (valve) stenosis: Secondary | ICD-10-CM | POA: Diagnosis not present

## 2021-12-03 DIAGNOSIS — E039 Hypothyroidism, unspecified: Secondary | ICD-10-CM | POA: Diagnosis not present

## 2021-12-10 ENCOUNTER — Other Ambulatory Visit: Payer: Self-pay

## 2021-12-10 ENCOUNTER — Encounter (HOSPITAL_BASED_OUTPATIENT_CLINIC_OR_DEPARTMENT_OTHER): Payer: Medicare HMO | Attending: General Surgery | Admitting: General Surgery

## 2021-12-10 DIAGNOSIS — I87321 Chronic venous hypertension (idiopathic) with inflammation of right lower extremity: Secondary | ICD-10-CM | POA: Diagnosis not present

## 2021-12-10 DIAGNOSIS — I872 Venous insufficiency (chronic) (peripheral): Secondary | ICD-10-CM | POA: Diagnosis not present

## 2021-12-10 DIAGNOSIS — G3109 Other frontotemporal dementia: Secondary | ICD-10-CM | POA: Insufficient documentation

## 2021-12-10 DIAGNOSIS — R69 Illness, unspecified: Secondary | ICD-10-CM | POA: Diagnosis not present

## 2021-12-10 DIAGNOSIS — E7800 Pure hypercholesterolemia, unspecified: Secondary | ICD-10-CM | POA: Insufficient documentation

## 2021-12-10 DIAGNOSIS — I350 Nonrheumatic aortic (valve) stenosis: Secondary | ICD-10-CM | POA: Diagnosis not present

## 2021-12-10 DIAGNOSIS — I890 Lymphedema, not elsewhere classified: Secondary | ICD-10-CM | POA: Diagnosis not present

## 2021-12-10 DIAGNOSIS — E039 Hypothyroidism, unspecified: Secondary | ICD-10-CM | POA: Diagnosis not present

## 2021-12-10 DIAGNOSIS — L97328 Non-pressure chronic ulcer of left ankle with other specified severity: Secondary | ICD-10-CM | POA: Diagnosis not present

## 2021-12-10 DIAGNOSIS — I10 Essential (primary) hypertension: Secondary | ICD-10-CM | POA: Diagnosis not present

## 2021-12-10 DIAGNOSIS — F0280 Dementia in other diseases classified elsewhere without behavioral disturbance: Secondary | ICD-10-CM | POA: Insufficient documentation

## 2021-12-10 DIAGNOSIS — E785 Hyperlipidemia, unspecified: Secondary | ICD-10-CM | POA: Diagnosis not present

## 2021-12-10 NOTE — Progress Notes (Signed)
Adrienne Jackson ([id number]) ?Visit Report for 12/10/2021 ?Chief Complaint Document Details ?Patient Name: Date of Service: ?Adrienne Jackson [facility] 12/10/2021 9:00 A M ?Medical Record Number: [id number] ?Patient Account Number: [id number] ?Date of Birth/Sex: Treating RN: ?[birth year] (81 y.o. F) ?Primary Care Provider: [name] Other Clinician: ?Referring Provider: ?Treating Provider/Extender: Duanne Guess ?Rankins, [name] ?Weeks in Treatment: 0 ?Information Obtained from: Patient ?Chief Complaint ?Patient presents to the wound care center due with non-wound condition(s) ?Electronic Signature(s) ?Signed: 12/10/2021 9:19:24 AM By: Duanne Guess MD FACS ?Entered By: Duanne Guess on 12/10/2021 09:19:24 ?-------------------------------------------------------------------------------- ?HPI Details ?Patient Name: Date of Service: ?Adrienne Jackson [facility] 12/10/2021 9:00 A M ?Medical Record Number: [id number] ?Patient Account Number: [id number] ?Date of Birth/Sex: Treating RN: ?Jul 08, 1941 (81 y.o. F) ?Primary Care Provider: [name] Other Clinician: ?Referring Provider: ?Treating Provider/Extender: Duanne Guess ?Rankins, [name] ?Weeks in Treatment: 0 ?History of Present Illness ?HPI Description: 12/10/2021: This is an 81 year old woman who was referred by her primary care provider due to concerns for lower extremity swelling. ?Apparently, there were some open areas on the left lower extremity, but these are closed as of today. Her PCP apparently was concerned about cellulitis and ?the edema. She was prescribed Lasix for 5-day course as well as a 10-day course of doxycycline. Based upon reading the primary care providers note, it ?sounds like she was referred to vascular surgery but has not yet received an appointment. No venous studies are available in the medical record for my ?review. She does have a past medical history of frontotemporal dementia, hypertension, hyperlipidemia, and the  question of congestive heart failure has been ?raised, but the last echocardiogram that I was able to review shows a normal ejection fraction and no comment made regarding diastolic dysfunction. The ?patient is accompanied by her daughter who provides the majority of the history. She does state that the patient does have compression stockings that were ?20-30 mmHg pressure, but they are old and the patient does not wear them regularly. ?Electronic Signature(s) ?Signed: 12/10/2021 9:21:56 AM By: Duanne Guess MD FACS ?Entered By: Duanne Guess on 12/10/2021 09:21:56 ?-------------------------------------------------------------------------------- ?Physical Exam Details ?Patient Name: Date of Service: ?Adrienne Jackson [facility] 12/10/2021 9:00 A M ?Medical Record Number: [id number] ?Patient Account Number: [id number] ?Date of Birth/Sex: Treating RN: ?09-21-1941 (81 y.o. F) ?Primary Care Provider: Other Clinician: ?[name] ?Referring Provider: ?Treating Provider/Extender: Duanne Guess ?Rankins, [name] ?Weeks in Treatment: 0 ?Constitutional ?. . . . No acute distress. ?Respiratory ?Normal work of breathing on room air.Marland Kitchen ?Cardiovascular ?Dorsalis pedis and posterior tibialis pulses are easily palpable bilaterally.. 2+ pitting edema on the left and 3+ pitting edema on the right.Marland Kitchen ?Integumentary (Hair, Skin) ?The skin around her ankle on the left is a bit erythematous, suggestive of stasis dermatitis. No hemosiderin deposits or woody tissue to suggest longstanding ?venous stasis.Marland Kitchen ?Electronic Signature(s) ?Signed: 12/10/2021 9:23:19 AM By: Duanne Guess MD FACS ?Entered By: Duanne Guess on 12/10/2021 09:23:18 ?-------------------------------------------------------------------------------- ?Physician Orders Details ?Patient Name: Date of Service: ?Adrienne Jackson [facility] 12/10/2021 9:00 A M ?Medical Record Number: [id number] ?Patient Account Number: [id number] ?Date of Birth/Sex: Treating RN: ?19-Jun-1941  (81 y.o. Adrienne Jackson ?Primary Care Provider: [name] Other Clinician: ?Referring Provider: ?Treating Provider/Extender: Duanne Guess ?Rankins, [name] ?Weeks in Treatment: 0 ?Verbal / Phone Orders: No ?Diagnosis Coding ?ICD-10 Coding ?Code Description ?S85.462 Chronic venous hypertension (idiopathic) with inflammation of right lower extremity ?G31.09 Other frontotemporal neurocognitive disorder ?I10 Essential (primary) hypertension ?I87.2 Venous insufficiency (chronic) (peripheral) ?Discharge From  [facility] Services ?Discharge from Wound Care Center - Consult only - no open wound. Continue to follow up with primary care physician, if swelling continues with ?use of compression may need referral to Vascular for Venous Reflux study. ?Edema Control - Lymphedema / SCD / Other ?Elevate legs to the level of the heart or above for 30 minutes daily and/or when sitting, a frequency of: - throughout the day ?Avoid standing for long periods of time. ?Exercise regularly ?Moisturize legs daily. ?Compression stocking or Garment 20-30 mm/Hg pressure to: ?Electronic Signature(s) ?Signed: 12/10/2021 9:23:44 AM By: Duanne Guess MD FACS ?Entered By: Duanne Guess on 12/10/2021 09:23:44 ?-------------------------------------------------------------------------------- ?Problem List Details ?Patient Name: ?Date of Service: ?Adrienne Jackson [facility] 12/10/2021 9:00 A M ?Medical Record Number: [id number] ?Patient Account Number: [id number] ?Date of Birth/Sex: ?Treating RN: ?1941-09-23 (81 y.o. F) ?Primary Care Provider: [name] ?Other Clinician: ?Referring Provider: ?Treating Provider/Extender: Duanne Guess ?Rankins, [name] ?Weeks in Treatment: 0 ?Active Problems ?ICD-10 ?Encounter ?Code Description Active Date MDM ?Diagnosis ?C78.938 Chronic venous hypertension (idiopathic) with inflammation of right lower 12/10/2021 No Yes ?extremity ?G31.09 Other frontotemporal neurocognitive disorder 12/10/2021 No  Yes ?I10 Essential (primary) hypertension 12/10/2021 No Yes ?I87.2 Venous insufficiency (chronic) (peripheral) 12/10/2021 No Yes ?Inactive Problems ?Resolved Problems ?Electronic Signature(s) ?Signed: 12/10/2021 9:19:04 AM By: Duanne Guess MD FACS ?Entered By: Duanne Guess on 12/10/2021 09:19:04 ?-------------------------------------------------------------------------------- ?Progress Note Details ?Patient Name: ?Date of Service: ?Adrienne Jackson [facility] 12/10/2021 9:00 A M ?Medical Record Number: [id number] ?Patient Account Number: [id number] ?Date of Birth/Sex: ?Treating RN: ?Aug 04, 1941 (81 y.o. F) ?Primary Care Provider: [name] ?Other Clinician: ?Referring Provider: ?Treating Provider/Extender: Duanne Guess ?Rankins, [name] ?Weeks in Treatment: 0 ?Subjective ?Chief Complaint ?Information obtained from Patient ?Patient presents to the wound care center due with non-wound condition(s) ?History of Present Illness (HPI) ?12/10/2021: This is an 81 year old woman who was referred by her primary care provider due to concerns for lower extremity swelling. Apparently, there were ?some open areas on the left lower extremity, but these are closed as of today. Her PCP apparently was concerned about cellulitis and the edema. She was ?prescribed Lasix for 5-day course as well as a 10-day course of doxycycline. Based upon reading the primary care providers note, it sounds like she was ?referred to vascular surgery but has not yet received an appointment. No venous studies are available in the medical record for my review. She does have a ?past medical history of frontotemporal dementia, hypertension, hyperlipidemia, and the question of congestive heart failure has been raised, but the last ?echocardiogram that I was able to review shows a normal ejection fraction and no comment made regarding diastolic dysfunction. The patient is accompanied ?by her daughter who provides the majority of the history. She  does state that the patient does have compression stockings that were 20-30 mmHg pressure, ?but they are old and the patient does not wear them regularly. ?Patient History ?Information obtained from [employer]

## 2021-12-10 NOTE — Progress Notes (Signed)
Adrienne Jackson, Adrienne Jackson ([id number]) ?Visit Report for 12/10/2021 ?Abuse Risk Screen Details ?Patient Name: Date of Service: ?Elige Radon [facility] 12/10/2021 9:00 A M ?Medical Record Number: [id number] ?Patient Account Number: [id number] ?Date of Birth/Sex: Treating RN: ?02-13-1941 (81 y.o. Adrienne Jackson, Adrienne Jackson ?Primary Care Meleni Delahunt: [name] Other Clinician: ?Referring Aeon Koors: ?Treating Abdulahi Schor/Extender: Duanne Guess ?Rankins, [name] ?Weeks in Treatment: 0 ?Abuse Risk Screen Items ?Answer ?ABUSE RISK SCREEN: ?Has anyone close to you tried to hurt or harm you recentlyo No ?Do you feel uncomfortable with anyone in your familyo No ?Has anyone forced you do things that you didnt want to doo No ?Electronic Signature(s) ?Signed: 12/10/2021 1:42:34 PM By: Zandra Abts RN, BSN ?Entered By: Zandra Abts on 12/10/2021 08:52:41 ?-------------------------------------------------------------------------------- ?Activities of Daily Living Details ?Patient Name: Date of Service: ?Elige Radon [facility] 12/10/2021 9:00 A M ?Medical Record Number: [id number] ?Patient Account Number: [id number] ?Date of Birth/Sex: Treating RN: ?[birth year] (81 y.o. Adrienne Jackson, Adrienne Jackson ?Primary Care Ellionna Buckbee: [name] Other Clinician: ?Referring Belia Febo: ?Treating Tanasha Menees/Extender: Duanne Guess ?Rankins, [name] ?Weeks in Treatment: 0 ?Activities of Daily Living Items ?Answer ?Activities of Daily Living (Please select one for each item) ?Drive Automobile Not Able ?T Medications ?ake Need Assistance ?Use T elephone Not Able ?Care for Appearance Need Assistance ?Use T oilet Need Assistance ?Bath / Shower Need Assistance ?Dress Self Need Assistance ?Feed Self Completely Able ?Walk Completely Able ?Get In / Out Bed Completely Able ?Housework Need Assistance ?Prepare Meals Need Assistance ?Handle Money Need Assistance ?Shop for Self Need Assistance ?Electronic Signature(s) ?Signed: 12/10/2021 1:42:34 PM By: Zandra Abts RN,  BSN ?Entered By: Zandra Abts on 12/10/2021 08:53:22 ?-------------------------------------------------------------------------------- ?Education Screening Details ?Patient Name: ?Date of Service: ?Elige Radon [facility] 12/10/2021 9:00 A M ?Medical Record Number: [id number] ?Patient Account Number: [id number] ?Date of Birth/Sex: ?Treating RN: ?05-Aug-1941 (81 y.o. Adrienne Jackson, Adrienne Jackson ?Primary Care Terrilee Dudzik: [name] ?Other Clinician: ?Referring Dontrez Pettis: ?Treating Latacha Texeira/Extender: Duanne Guess ?Rankins, [name] ?Weeks in Treatment: 0 ?Primary Learner Assessed: Caregiver daughter ?Reason Patient is not Primary Learner: Patient has dementia and aphasia ?Learning Preferences/Education Level/Primary Language ?Learning Preference: Explanation, Printed Material ?Preferred Language: English ?Cognitive Barrier ?Language Barrier: No ?Translator Needed: No ?Memory Deficit: No ?Emotional Barrier: No ?Cultural/Religious Beliefs Affecting Medical Care: No ?Physical Barrier ?Impaired Vision: No ?Impaired Hearing: No ?Decreased Hand dexterity: No ?Knowledge/Comprehension ?Knowledge Level: High ?Comprehension Level: High ?Ability to understand written instructions: High ?Ability to understand verbal instructions: High ?Motivation ?Anxiety Level: Calm ?Cooperation: Cooperative ?Education Importance: Acknowledges Need ?Interest in Health Problems: Asks Questions ?Perception: Coherent ?Willingness to Engage in Self-Management High ?Activities: ?Readiness to Engage in Self-Management High ?Activities: ?Electronic Signature(s) ?Signed: 12/10/2021 1:42:34 PM By: Zandra Abts RN, BSN ?Entered By: Zandra Abts on 12/10/2021 08:53:58 ?-------------------------------------------------------------------------------- ?Fall Risk Assessment Details ?Patient Name: ?Date of Service: ?Elige Radon [facility] 12/10/2021 9:00 A M ?Medical Record Number: [id number] ?Patient Account Number: [id number] ?Date of Birth/Sex: ?Treating  RN: ?September 05, 1941 (81 y.o. Adrienne Jackson, Adrienne Jackson ?Primary Care Draven Laine: [name] ?Other Clinician: ?Referring Kaylyn Garrow: ?Treating Chinwe Lope/Extender: Duanne Guess ?Rankins, [name] ?Weeks in Treatment: 0 ?Fall Risk Assessment Items ?Have you had 2 or more falls in the last 12 monthso 0 No ?Have you had any fall that resulted in injury in the last 12 monthso 0 No ?FALLS RISK SCREEN ?History of falling - immediate or within 3 months 0 No ?Secondary diagnosis (Do you have 2 or more medical diagnoseso) 15 Yes ?Ambulatory aid ?None/bed rest/wheelchair/nurse 0 Yes ?Crutches/cane/walker 0 No ?Furniture 0 No ?  Intravenous therapy Access/Saline/Heparin Lock 0 No ?Gait/Transferring ?Normal/ bed rest/ wheelchair 0 Yes ?Weak (short steps with or without shuffle, stooped but able to lift head while walking, may seek 0 No ?support from furniture) ?Impaired (short steps with shuffle, may have difficulty arising from chair, head down, impaired 0 No ?balance) ?Mental Status ?Oriented to own ability 0 Yes ?Electronic Signature(s) ?Signed: 12/10/2021 1:42:34 PM By: Zandra Abts RN, BSN ?Entered By: Zandra Abts on 12/10/2021 08:54:22 ?-------------------------------------------------------------------------------- ?Nutrition Risk Screening Details ?Patient Name: ?Date of Service: ?Elige Radon [facility] 12/10/2021 9:00 A M ?Medical Record Number: [id number] ?Patient Account Number: [id number] ?Date of Birth/Sex: ?Treating RN: ?11/12/1940 (81 y.o. Adrienne Jackson, Adrienne Jackson ?Primary Care Hser Belanger: [name] ?Other Clinician: ?Referring Rahkim Rabalais: ?Treating Oluwatoyin Banales/Extender: Duanne Guess ?Rankins, [name] ?Weeks in Treatment: 0 ?Height (in): ?Weight (lbs): ?Body Mass Index (BMI): ?Nutrition Risk Screening Items ?Score Screening ?NUTRITION RISK SCREEN: ?I have an illness or condition that made me change the kind and/or amount of food I eat 0 No ?I eat fewer than two meals per day 0 No ?I eat few fruits and vegetables, or  milk products 0 No ?I have three or more drinks of beer, liquor or wine almost every day 0 No ?I have tooth or mouth problems that make it hard for me to eat 0 No ?I don't always have enough money to buy the food I need 0 No ?I eat alone most of the time 0 No ?I take three or more different prescribed or over-the-counter drugs a day 1 Yes ?Without wanting to, I have lost or gained 10 pounds in the last six months 0 No ?I am not always physically able to shop, cook and/or feed myself 2 Yes ?Nutrition Protocols ?Good Risk Protocol ?Moderate Risk Protocol 0 Provide education on nutrition ?High Risk Proctocol ?Risk Level: Moderate Risk ?Score: 3 ?Electronic Signature(s) ?Signed: 12/10/2021 1:42:34 PM By: Zandra Abts RN, BSN ?Entered By: Zandra Abts on 12/10/2021 08:55:05 ?

## 2021-12-10 NOTE — Progress Notes (Signed)
Adrienne Jackson, Adrienne Jackson ([id number]) ?Visit Report for 12/10/2021 ?Allergy List Details ?Patient Name: Date of Service: ?Adrienne Jackson [facility] 12/10/2021 9:00 A M ?Medical Record Number: [id number] ?Patient Account Number: [id number] ?Date of Birth/Sex: Treating RN: ?Adrienne Jackson (81 y.o. Adrienne Jackson, Adrienne Jackson ?Primary Care Virgie Kunda: [name] Other Clinician: ?Referring Izea Livolsi: ?Treating Cordie Buening/Extender: Duanne Guess ?Rankins, Adrienne Jackson ?Weeks in Treatment: 0 ?Allergies ?Active Allergies ?No Known Drug Allergies ?Allergy Notes ?Electronic Signature(s) ?Signed: 12/10/2021 1:42:34 PM By: Zandra Abts RN, BSN ?Entered By: Zandra Abts on 12/10/2021 08:47:15 ?-------------------------------------------------------------------------------- ?Arrival Information Details ?Patient Name: Date of Service: ?Adrienne Jackson [facility] 12/10/2021 9:00 A M ?Medical Record Number: [id number] ?Patient Account Number: [id number] ?Date of Birth/Sex: Treating RN: ?Adrienne Jackson (81 y.o. Adrienne Jackson, Adrienne Jackson ?Primary Care Edita Weyenberg: [name] Other Clinician: ?Referring Meet Weathington: ?Treating Tiphanie Vo/Extender: Duanne Guess ?Rankins, Adrienne Jackson ?Weeks in Treatment: 0 ?Visit Information ?Patient Arrived: Ambulatory ?Arrival Time: 08:42 ?Accompanied By: daughter ?Transfer Assistance: None ?Patient Identification Verified: Yes ?Secondary Verification Process Completed: Yes ?Patient Requires Transmission-Based Precautions: No ?Patient Has Alerts: No ?Electronic Signature(s) ?Signed: 12/10/2021 1:42:34 PM By: Zandra Abts RN, BSN ?Entered By: Zandra Abts on 12/10/2021 08:44:16 ?-------------------------------------------------------------------------------- ?Clinic Level of Care Assessment Details ?Patient Name: Date of Service: ?Adrienne Jackson [facility] 12/10/2021 9:00 A M ?Medical Record Number: [id number] ?Patient Account Number: [id number] ?Date of Birth/Sex: Treating RN: ?[birth year] (81 y.o. Adrienne Jackson, Adrienne Jackson ?Primary Care Shakeerah Gradel:  [name] Other Clinician: ?Referring Vinaya Sancho: ?Treating Daune Colgate/Extender: Duanne Guess ?Rankins, Adrienne Jackson ?Weeks in Treatment: 0 ?Clinic Level of Care Assessment Items ?TOOL 2 Quantity Score ?X- 1 0 ?Use when only an EandM is performed on the INITIAL visit ?ASSESSMENTS - Nursing Assessment / Reassessment ?X- 1 20 ?General Physical Exam (combine w/ comprehensive assessment (listed just below) when performed on new pt. evals) ?X- 1 25 ?Comprehensive Assessment (HX, ROS, Risk Assessments, Wounds Hx, etc.) ?ASSESSMENTS - Wound and Skin A ssessment / Reassessment ?[]  - 0 ?Simple Wound Assessment / Reassessment - one wound ?[]  - 0 ?Complex Wound Assessment / Reassessment - multiple wounds ?[]  - 0 ?Dermatologic / Skin Assessment (not related to wound area) ?ASSESSMENTS - Ostomy and/or Continence Assessment and Care ?[]  - 0 ?Incontinence Assessment and Management ?[]  - 0 ?Ostomy Care Assessment and Management (repouching, etc.) ?PROCESS - Coordination of Care ?X - Simple Patient / Family Education for ongoing care 1 15 ?[]  - 0 ?Complex (extensive) Patient / Family Education for ongoing care ?X- 1 10 ?Staff obtains Consents, Records, T Results / Process Orders ?est ?[]  - 0 ?Staff telephones HHA, Nursing Homes / Clarify orders / etc ?[]  - 0 ?Routine Transfer to another Facility (non-emergent condition) ?[]  - 0 ?Routine Hospital Admission (non-emergent condition) ?[]  - 0 ?New Admissions / / Ordering NPWT Apligraf, etc. ?, ?[]  - 0 ?Emergency Hospital Admission (emergent condition) ?X- 1 10 ?Simple Discharge Coordination ?[]  - 0 ?Complex (extensive) Discharge Coordination ?PROCESS - Special Needs ?[]  - 0 ?Pediatric / Minor Patient Management ?[]  - 0 ?Isolation Patient Management ?[]  - 0 ?Hearing / Language / Visual special needs ?[]  - 0 ?Assessment of Community assistance (transportation, D/C planning, etc.) ?[]  - 0 ?Additional assistance / Altered mentation ?[]  - 0 ?Support Surface(s)  Assessment (bed, cushion, seat, etc.) ?INTERVENTIONS - Wound Cleansing / Measurement ?[]  - 0 ?Wound Imaging (photographs - any number of wounds) ?[]  - 0 ?Wound Tracing (instead of photographs) ?[]  - 0 ?Simple Wound Measurement - one wound ?[]  - 0 ?Complex Wound Measurement - multiple wounds ?[]  - 0 ?Simple  Wound Cleansing - one wound ?[]  - 0 ?Complex Wound Cleansing - multiple wounds ?INTERVENTIONS - Wound Dressings ?[]  - 0 ?Small Wound Dressing one or multiple wounds ?[]  - 0 ?Medium Wound Dressing one or multiple wounds ?[]  - 0 ?Large Wound Dressing one or multiple wounds ?[]  - 0 ?Application of Medications - injection ?INTERVENTIONS - Miscellaneous ?[]  - 0 ?External ear exam ?[]  - 0 ?Specimen Collection (cultures, biopsies, blood, body fluids, etc.) ?[]  - 0 ?Specimen(s) / Culture(s) sent or taken to Lab for analysis ?[]  - 0 ?Patient Transfer (multiple staff / / Similar devices) ?[]  - 0 ?Simple Staple / Suture removal (25 or less) ?[]  - 0 ?Complex Staple / Suture removal (26 or more) ?[]  - 0 ?Hypo / Hyperglycemic Management (close monitor of Blood Glucose) ?[]  - 0 ?Ankle / Brachial Index (ABI) - do not check if billed separately ?Has the patient been seen at the hospital within the last three years: Yes ?Total Score: 80 ?Level Of Care: New/Established - Level 3 ?Electronic Signature(s) ?Signed: 12/10/2021 1:42:34 PM By: RN, BSN ?Entered By: on 12/10/2021 13:19:49 ?-------------------------------------------------------------------------------- ?Encounter Discharge Information Details ?Patient Name: Date of Service: ? [facility] 12/10/2021 9:00 A M ?Medical Record Number: ?Patient Account Number: Nurse, adult ?Date of Birth/Sex: Treating RN: ?[birth year] (81 y.o. , Adrienne Jackson ?Primary Care Roni Scow: Other Clinician: ?Referring Rishika Mccollom: ?Treating Said Rueb/Extender: 12/12/2021 ?Rankins, Zandra Abts Jackson ?Weeks in Treatment: 0 ?Encounter  Discharge Information Items ?Discharge Condition: Stable ?Ambulatory Status: Ambulatory ?Discharge Destination: Home ?Transportation: Private Auto ?Accompanied By: alone ?Schedule Follow-up Appointment: Yes ?Clinical Summary of Care: Patient Declined ?Electronic Signature(s) ?Signed: 12/10/2021 1:42:34 PM By: 12/12/2021 RN, BSN ?Entered By: Adrienne Jackson on 12/10/2021 13:20:42 ?-------------------------------------------------------------------------------- ?Multi Wound Chart Details ?Patient Name: Date of Service: ?12/12/2021 [facility] 12/10/2021 9:00 A M ?Medical Record Number: 12/9/Jackson ?Patient Account Number: (83 ?Date of Birth/Sex: Treating RN: ?[birth year] (81 y.o. F) ?Primary Care Emmert Roethler: Duanne Guess Other Clinician: ?Referring Donielle Kaigler: ?Treating Johnette Teigen/Extender: Adrienne ?Rankins, 12/12/2021 Jackson ?Weeks in Treatment: 0 ?Vital Signs ?Height(in): ?Pulse(bpm): 75 ?Weight(lbs): ?Blood Pressure(mmHg): 135/77 ?Body Mass Index(BMI): ?Temperature(??F): 98.0 ?Respiratory Rate(breaths/min): 18 ?Wound Assessments ?Treatment Notes ?Electronic Signature(s) ?Signed: 12/10/2021 9:19:11 AM By: Zandra Abts MD FACS ?Entered By: 12/12/2021 on 12/10/2021 09:19:11 ?-------------------------------------------------------------------------------- ?Pain Assessment Details ?Patient Name: ?Date of Service: ?[facility] 12/10/2021 9:00 A M ?Medical Record Number: [id number] ?Patient Account Number: 12/9/Jackson ?Date of Birth/Sex: ?Treating RN: ?[birth year] (81 y.o. Duanne Guess, Adrienne Jackson ?Primary Care Kenzel Ruesch: Adrienne Jackson ?Other Clinician: ?Referring Darian Ace: ?Treating Eladio Dentremont/Extender: 12/12/2021 ?Rankins, Duanne Guess Jackson ?Weeks in Treatment: 0 ?Active Problems ?Location of Pain Severity and Description of Pain ?Patient Has Paino No ?Site Locations ?Pain Management and Medication ?Current Pain Management: ?Electronic Signature(s) ?Signed: 12/10/2021 1:42:34 PM By: 12/12/2021 RN,  BSN ?Entered By: Adrienne Jackson on 12/10/2021 08:55:14 ?-------------------------------------------------------------------------------- ?Patient/Caregiver Education Details ?Patient Name: ?Date of Servic

## 2021-12-15 NOTE — Progress Notes (Signed)
?Cardiology Clinic Note  ? ?Patient Name: Adrienne Jackson ?Date of Encounter: 12/17/2021 ? ?Primary Care Provider:  [name], MD ?Primary Cardiologist:  Adrienne Poisson, MD ? ?Patient Profile  ?  ?81 year old female with history of mild to moderate aortic stenosis per echo on 07/15/2021, hypertension, hyperlipidemia, hypothyroidism, and dementia.  Prior history of hospital admission in 07/2017 for syncopal episode while seated at the table and was reportedly unconscious for 15 minutes.  Her head CTA, brain MRI/MRA were negative for an acute stroke.  She was noted to be bradycardic with heart rates in the 40s and therefore beta-blocker was discontinued.  Echocardiogram revealed normal LVEF of 60 to 65% with mild LVH and normal diastolic function with moderate AAS.  Outpatient event monitor showed occasional PACs but otherwise unremarkable.  The syncopal episode was felt likely to be due to bradycardia. ? ?Other history includes frontotemporal dementia.  Last seen in the office by Adrienne Jackson. PA on 07/23/2021 on follow-up from recent echocardiogram.  After provider entered the room the patient  began dazing off and then started slumping over gradually.  But she was responsive to her name and became more alert.  She never lost pulse or stopped breathing but continued to slump over and when not stimulated.  She was found to to be hypotensive with a blood pressure of 88/45 with a heart rate of 65.  The patient had an incontinent bowel movement.  She was sent to ED via EMS. ? ?Past Medical History  ?  ?Past Medical History:  ?Diagnosis Date  ? Aortic stenosis   ? Hyperlipidemia   ? Hypertension   ? Hypothyroidism   ? Syncope 08/18/2017  ? ?Past Surgical History:  ?Procedure Laterality Date  ? ABDOMINAL HYSTERECTOMY    ? ? ?Allergies ? ?No Known Allergies ? ?History of Present Illness  ?  ?Adrienne Jackson presents today on follow-up with above mentioned history of frontotemporal dementia.last being  seen in the office on 07/23/2021 after syncopal episode, becoming unresponsive with hypotension. She has a history of The patient was sent to the ED. it was felt to be related to a vagal episode after having an incontinent bowel movement.  She was ruled out for seizure activity.  CT scan did not reveal any evidence of bleeding, MRI was negative for CVA.   ? ?She is being treated for wound with idiopathic venous hypertension and inflammation of the right lower extremity, cellulitis.  She was advised to continue treatment and to wear compression stockings as she does not wear them regularly.  She was also found to have stasis dermatitis there was no open wound at the time of the last office visit.  This was completed on 12/10/2021 by Dr. Duanne Jackson. ? ?She is also been referred to vein and vascular service for repeat ABIs, as last read on the 2021 and she is having more issues with the lower left leg.  ABI revealed 0.91 on the left and 1.01 on the right at that time. ? ?She is here with her daughter and has not had any more syncopal episodes, fugax, bowel or bladder incontinence.  She her daughter is currently getting her set up to be in an assisted living facility where she will share a room with her sister.  This facility will be closer to [place] where the daughter lives. ? ?Home Medications  ?  ?Current Outpatient Medications  ?Medication Sig Dispense Refill  ? chlorthalidone (HYGROTON) 25 MG tablet 1 tablet 90 tablet 0  ?  escitalopram (LEXAPRO) 20 MG tablet 1 and 1/2 tablet daily 120 tablet 3  ? ferrous sulfate 325 (65 FE) MG tablet 1 tablet    ? furosemide (LASIX) 20 MG tablet Take 20 mg by mouth daily.    ? levothyroxine (SYNTHROID) 125 MCG tablet 1 tablet in the morning on an empty stomach    ? lisinopril (ZESTRIL) 20 MG tablet Take 1 tablet (20 mg total) by mouth daily. 90 tablet 0  ? Multiple Vitamins-Minerals (MULTIVITAMIN ADULTS 50+) TABS 1 tablet    ? Omega-3 Fatty Acids (FISH OIL) 1000 MG CAPS Take  1,000 mg by mouth daily.    ? rosuvastatin (CRESTOR) 20 MG tablet Take 1 tablet (20 mg total) by mouth daily. 90 tablet 0  ? zoledronic acid (RECLAST) 5 MG/100ML SOLN injection Inject 5 mg into the vein once.    ? ?No current facility-administered medications for this visit.  ?  ? ?Family History  ?  ?Family History  ?Problem Relation Age of Onset  ? Heart disease Father   ? ?She indicated that her father is deceased. ? ?Social History  ?  ?Social History  ? ?Socioeconomic History  ? Marital status: Single  ?  Spouse name: Not on file  ? Number of children: Not on file  ? Years of education: Not on file  ? Highest education level: Not on file  ?Occupational History  ? Not on file  ?Tobacco Use  ? Smoking status: Never  ? Smokeless tobacco: Never  ?Vaping Use  ? Vaping Use: Never used  ?Substance and Sexual Activity  ? Alcohol use: No  ? Drug use: No  ? Sexual activity: Not Currently  ?Other Topics Concern  ? Not on file  ?Social History Narrative  ? Lives with sister in a two story home  ?   ? Right handed  ?   ? Highest level of edu- 12th grade  ? ?Social Determinants of Health  ? ?Financial Resource Strain: Not on file  ?Food Insecurity: Not on file  ?Transportation Needs: Not on file  ?Physical Activity: Not on file  ?Stress: Not on file  ?Social Connections: Not on file  ?Intimate Partner Violence: Not on file  ?  ? ?Review of Systems  ?  ?General:  No chills, fever, night sweats or weight changes.  ?Cardiovascular:  No chest pain, dyspnea on exertion, edema, orthopnea, palpitations, paroxysmal nocturnal dyspnea. ?Dermatological: No rash, lesions/masses ?Respiratory: No cough, dyspnea ?Urologic: No hematuria, dysuria ?Abdominal:   No nausea, vomiting, diarrhea, bright red blood per rectum, melena, or hematemesis ?Neurologic:  No visual changes, wkns, continued deterioration with dementia. ?All other systems reviewed and are otherwise negative except as noted above. ? ?  ? ?Physical Exam  ?  ?VS:  BP 118/72    Pulse 66   Ht  (1.702 m)   Wt 189 lb (85.7 kg)   BMI 29.60 kg/m?  , BMI Body mass index is 29.6 kg/m?. ?    ?GEN: Well nourished, well developed, in no acute distress. ?HEENT: normal. ?Neck: Supple, no JVD, carotid bruits, or masses. ?Cardiac: RRR, 2/6 systolic murmurs, heard best at the right sternal border with radiation into the carotids bilaterally, rubs, or gallops. No clubbing, cyanosis, edema.  Radials/DP/PT 2+ and equal bilaterally.  Left femoral bruit auscultated. ?Respiratory:  Respirations regular and unlabored, clear to auscultation bilaterally. ?GI: Soft, nontender, nondistended, BS + x 4.  Aortic abdominal bruit is auscultated without pulsation ?MS: no deformity or atrophy. ?Skin: warm  and dry, no rash. ?Neuro:  Strength and sensation are intact. ?Psych: Normal affect. ? ?Accessory Clinical Findings  ?  ?ECG personally reviewed by me today-normal sinus rhythm, sinus bradycardia, heart rate of 66 bpm with first-degree AV block PR interval 0.24 ms,, mild left ventricular hypertrophy o acute changes ? ?Lab Results  ?Component Value Date  ? WBC 5.1 07/23/2021  ? HGB 11.8 (L) 07/23/2021  ? HCT 35.8 (L) 07/23/2021  ? MCV 98.4 07/23/2021  ? PLT 168 07/23/2021  ? ?Lab Results  ?Component Value Date  ? CREATININE 1.41 (H) 07/23/2021  ? BUN 18 07/23/2021  ? NA 135 07/23/2021  ? K 3.6 07/23/2021  ? CL 100 07/23/2021  ? CO2 25 07/23/2021  ? ?No results found for: ALT, AST, GGT, ALKPHOS, BILITOT ?Lab Results  ?Component Value Date  ? CHOL 128 08/18/2017  ? HDL 69 08/18/2017  ? LDLCALC 49 08/18/2017  ? TRIG 52 08/18/2017  ? CHOLHDL 1.9 08/18/2017  ?  ?Lab Results  ?Component Value Date  ? HGBA1C 5.8 (H) 08/18/2017  ? ? ?Review of Prior Studies: ?Echocardiogram 07/09/2021: ?Impressions: ? 1. The aortic valve is calcified. Aortic valve regurgitation is mild and  ?central in nature. Mild to moderate aortic valve stenosis.  ? 2. Valve Parameters: peak gradient 43 mm Hg, mean gradient 24 mm Hg, DVI  ?0.45, LV SVI  57 ml/m2, AVA 1.4 cm2.  ? 3. Left ventricular ejection fraction, by estimation, is 65 to 70%. The  ?left ventricle has normal function. The left ventricle has no regional  ?wall motion abnormalities. There i

## 2021-12-17 ENCOUNTER — Other Ambulatory Visit: Payer: Self-pay

## 2021-12-17 ENCOUNTER — Ambulatory Visit: Payer: Medicare HMO | Admitting: Adult Health

## 2021-12-17 ENCOUNTER — Encounter: Payer: Self-pay | Admitting: Adult Health

## 2021-12-17 VITALS — BP 118/72 | HR 66 | Ht 67.0 in | Wt 189.0 lb

## 2021-12-17 DIAGNOSIS — G3109 Other frontotemporal dementia: Secondary | ICD-10-CM | POA: Diagnosis not present

## 2021-12-17 DIAGNOSIS — E039 Hypothyroidism, unspecified: Secondary | ICD-10-CM | POA: Diagnosis not present

## 2021-12-17 DIAGNOSIS — R69 Illness, unspecified: Secondary | ICD-10-CM | POA: Diagnosis not present

## 2021-12-17 DIAGNOSIS — Z1231 Encounter for screening mammogram for malignant neoplasm of breast: Secondary | ICD-10-CM | POA: Diagnosis not present

## 2021-12-17 DIAGNOSIS — I739 Peripheral vascular disease, unspecified: Secondary | ICD-10-CM

## 2021-12-17 DIAGNOSIS — F0280 Dementia in other diseases classified elsewhere without behavioral disturbance: Secondary | ICD-10-CM

## 2021-12-17 DIAGNOSIS — I10 Essential (primary) hypertension: Secondary | ICD-10-CM

## 2021-12-17 DIAGNOSIS — I350 Nonrheumatic aortic (valve) stenosis: Secondary | ICD-10-CM

## 2021-12-17 NOTE — Patient Instructions (Signed)
Medication Instructions:  ?No changes ?*If you need a refill on your cardiac medications before your next appointment, please call your pharmacy* ? ? ?Lab Work: ?No labs ?If you have labs (blood work) drawn today and your tests are completely normal, you will receive your results only by: ?MyChart Message (if you have MyChart) OR ?A paper copy in the mail ?If you have any lab test that is abnormal or we need to change your treatment, we will call you to review the results. ? ? ?Testing/Procedures: ?No testing ? ? ?Follow-Up: ?At [facility], you and your health needs are our priority.  As part of our continuing mission to provide you with exceptional heart care, we have created designated Provider Care Teams.  These Care Teams include your primary Cardiologist (physician) and Advanced Practice Providers (APPs -  Physician Assistants and Nurse Practitioners) who all work together to provide you with the care you need, when you need it. ? ?We recommend signing up for the patient portal called "MyChart".  Sign up information is provided on this After Visit Summary.  MyChart is used to connect with patients for Virtual Visits (Telemedicine).  Patients are able to view lab/test results, encounter notes, upcoming appointments, etc.  Non-urgent messages can be sent to your provider as well.   ?To learn more about what you can do with MyChart, go to [email or web address].   ? ?Your next appointment:   ?6 month(s) ? ?The format for your next appointment:   ?In Person ? ?Provider:   ?Parke Poisson, MD   ? ? ? ?

## 2022-01-05 ENCOUNTER — Encounter: Payer: Self-pay | Admitting: Physician Assistant

## 2022-01-06 DIAGNOSIS — R062 Wheezing: Secondary | ICD-10-CM | POA: Diagnosis not present

## 2022-01-06 DIAGNOSIS — M25531 Pain in right wrist: Secondary | ICD-10-CM | POA: Diagnosis not present

## 2022-01-06 DIAGNOSIS — W19XXXA Unspecified fall, initial encounter: Secondary | ICD-10-CM | POA: Diagnosis not present

## 2022-01-06 DIAGNOSIS — S4991XA Unspecified injury of right shoulder and upper arm, initial encounter: Secondary | ICD-10-CM | POA: Diagnosis not present

## 2022-01-07 ENCOUNTER — Other Ambulatory Visit: Payer: Self-pay

## 2022-01-07 DIAGNOSIS — F0280 Dementia in other diseases classified elsewhere without behavioral disturbance: Secondary | ICD-10-CM

## 2022-01-09 ENCOUNTER — Other Ambulatory Visit: Payer: Self-pay | Admitting: Internal Medicine

## 2022-01-12 ENCOUNTER — Encounter: Payer: Self-pay | Admitting: Physician Assistant

## 2022-01-12 ENCOUNTER — Encounter: Payer: Self-pay | Admitting: Internal Medicine

## 2022-01-12 ENCOUNTER — Other Ambulatory Visit: Payer: Self-pay | Admitting: Physician Assistant

## 2022-01-18 ENCOUNTER — Encounter: Payer: Self-pay | Admitting: Podiatry

## 2022-01-18 NOTE — Telephone Encounter (Signed)
Adrienne Jackson, can you please assist this patient? Thanks! ?

## 2022-01-19 DIAGNOSIS — L97221 Non-pressure chronic ulcer of left calf limited to breakdown of skin: Secondary | ICD-10-CM | POA: Diagnosis not present

## 2022-01-19 DIAGNOSIS — R062 Wheezing: Secondary | ICD-10-CM | POA: Diagnosis not present

## 2022-01-19 DIAGNOSIS — I10 Essential (primary) hypertension: Secondary | ICD-10-CM | POA: Diagnosis not present

## 2022-01-19 DIAGNOSIS — H00011 Hordeolum externum right upper eyelid: Secondary | ICD-10-CM | POA: Diagnosis not present

## 2022-01-19 DIAGNOSIS — I872 Venous insufficiency (chronic) (peripheral): Secondary | ICD-10-CM | POA: Diagnosis not present

## 2022-01-19 DIAGNOSIS — S81802D Unspecified open wound, left lower leg, subsequent encounter: Secondary | ICD-10-CM | POA: Diagnosis not present

## 2022-01-19 DIAGNOSIS — E039 Hypothyroidism, unspecified: Secondary | ICD-10-CM | POA: Diagnosis not present

## 2022-01-19 DIAGNOSIS — E7800 Pure hypercholesterolemia, unspecified: Secondary | ICD-10-CM | POA: Diagnosis not present

## 2022-01-19 DIAGNOSIS — I350 Nonrheumatic aortic (valve) stenosis: Secondary | ICD-10-CM | POA: Diagnosis not present

## 2022-01-19 DIAGNOSIS — L98491 Non-pressure chronic ulcer of skin of other sites limited to breakdown of skin: Secondary | ICD-10-CM | POA: Diagnosis not present

## 2022-01-19 DIAGNOSIS — I83022 Varicose veins of left lower extremity with ulcer of calf: Secondary | ICD-10-CM | POA: Diagnosis not present

## 2022-01-19 DIAGNOSIS — M8580 Other specified disorders of bone density and structure, unspecified site: Secondary | ICD-10-CM | POA: Diagnosis not present

## 2022-01-19 DIAGNOSIS — Z0000 Encounter for general adult medical examination without abnormal findings: Secondary | ICD-10-CM | POA: Diagnosis not present

## 2022-01-19 DIAGNOSIS — E559 Vitamin D deficiency, unspecified: Secondary | ICD-10-CM | POA: Diagnosis not present

## 2022-01-21 ENCOUNTER — Ambulatory Visit: Payer: Medicare PPO | Admitting: Podiatry

## 2022-01-22 DIAGNOSIS — S81802D Unspecified open wound, left lower leg, subsequent encounter: Secondary | ICD-10-CM | POA: Diagnosis not present

## 2022-01-28 ENCOUNTER — Encounter: Payer: Self-pay | Admitting: Physician Assistant

## 2022-01-31 ENCOUNTER — Encounter: Payer: Self-pay | Admitting: Podiatry

## 2022-03-14 ENCOUNTER — Ambulatory Visit: Payer: Medicare PPO | Admitting: Physician Assistant

## 2022-03-25 ENCOUNTER — Ambulatory Visit: Payer: Medicare PPO | Admitting: Podiatry

## 2022-05-27 ENCOUNTER — Ambulatory Visit: Payer: Medicare PPO | Admitting: Podiatry

## 2022-07-29 ENCOUNTER — Ambulatory Visit: Payer: Medicare PPO | Admitting: Podiatry
